# Patient Record
Sex: Female | Born: 1940 | Race: White | Hispanic: No | Marital: Married | Smoking: Former smoker
Health system: Southern US, Community
[De-identification: ages and names within clinical notes are randomized; demographics above are authoritative.]

## PROBLEM LIST (undated history)

## (undated) DIAGNOSIS — R011 Cardiac murmur, unspecified: Secondary | ICD-10-CM

## (undated) DIAGNOSIS — J385 Laryngeal spasm: Secondary | ICD-10-CM

## (undated) DIAGNOSIS — R251 Tremor, unspecified: Secondary | ICD-10-CM

## (undated) DIAGNOSIS — F329 Major depressive disorder, single episode, unspecified: Secondary | ICD-10-CM

## (undated) DIAGNOSIS — J45909 Unspecified asthma, uncomplicated: Secondary | ICD-10-CM

## (undated) DIAGNOSIS — K5190 Ulcerative colitis, unspecified, without complications: Secondary | ICD-10-CM

## (undated) DIAGNOSIS — I350 Nonrheumatic aortic (valve) stenosis: Secondary | ICD-10-CM

## (undated) DIAGNOSIS — E669 Obesity, unspecified: Secondary | ICD-10-CM

## (undated) DIAGNOSIS — L309 Dermatitis, unspecified: Secondary | ICD-10-CM

## (undated) DIAGNOSIS — F32A Depression, unspecified: Secondary | ICD-10-CM

## (undated) DIAGNOSIS — M810 Age-related osteoporosis without current pathological fracture: Secondary | ICD-10-CM

## (undated) DIAGNOSIS — M8580 Other specified disorders of bone density and structure, unspecified site: Secondary | ICD-10-CM

## (undated) DIAGNOSIS — D649 Anemia, unspecified: Secondary | ICD-10-CM

## (undated) DIAGNOSIS — J984 Other disorders of lung: Secondary | ICD-10-CM

## (undated) DIAGNOSIS — Z87442 Personal history of urinary calculi: Secondary | ICD-10-CM

## (undated) HISTORY — DX: Tremor, unspecified: R25.1

## (undated) HISTORY — DX: Cardiac murmur, unspecified: R01.1

## (undated) HISTORY — PX: VOCAL CORD INJECTION: SHX2663

## (undated) HISTORY — DX: Anemia, unspecified: D64.9

## (undated) HISTORY — PX: COLONOSCOPY: SHX174

## (undated) HISTORY — DX: Age-related osteoporosis without current pathological fracture: M81.0

## (undated) HISTORY — DX: Major depressive disorder, single episode, unspecified: F32.9

## (undated) HISTORY — DX: Other disorders of lung: J98.4

## (undated) HISTORY — DX: Laryngeal spasm: J38.5

## (undated) HISTORY — DX: Obesity, unspecified: E66.9

## (undated) HISTORY — DX: Unspecified asthma, uncomplicated: J45.909

## (undated) HISTORY — DX: Depression, unspecified: F32.A

## (undated) HISTORY — DX: Ulcerative colitis, unspecified, without complications: K51.90

## (undated) HISTORY — DX: Dermatitis, unspecified: L30.9

## (undated) HISTORY — DX: Other specified disorders of bone density and structure, unspecified site: M85.80

---

## 2005-04-17 ENCOUNTER — Emergency Department (HOSPITAL_COMMUNITY): Admission: EM | Admit: 2005-04-17 | Discharge: 2005-04-17 | Payer: Self-pay | Admitting: Family Medicine

## 2005-12-24 ENCOUNTER — Ambulatory Visit: Payer: Self-pay | Admitting: Gastroenterology

## 2006-01-12 ENCOUNTER — Ambulatory Visit: Payer: Self-pay | Admitting: Gastroenterology

## 2006-01-12 ENCOUNTER — Encounter (INDEPENDENT_AMBULATORY_CARE_PROVIDER_SITE_OTHER): Payer: Self-pay | Admitting: *Deleted

## 2006-01-22 ENCOUNTER — Encounter: Admission: RE | Admit: 2006-01-22 | Discharge: 2006-01-22 | Payer: Self-pay | Admitting: Unknown Physician Specialty

## 2009-07-28 ENCOUNTER — Emergency Department (HOSPITAL_COMMUNITY)
Admission: EM | Admit: 2009-07-28 | Discharge: 2009-07-28 | Payer: Self-pay | Source: Home / Self Care | Admitting: Emergency Medicine

## 2009-08-02 DIAGNOSIS — L301 Dyshidrosis [pompholyx]: Secondary | ICD-10-CM | POA: Insufficient documentation

## 2009-08-02 DIAGNOSIS — R011 Cardiac murmur, unspecified: Secondary | ICD-10-CM

## 2009-08-02 HISTORY — DX: Cardiac murmur, unspecified: R01.1

## 2010-04-25 DIAGNOSIS — R053 Chronic cough: Secondary | ICD-10-CM | POA: Insufficient documentation

## 2010-06-16 DIAGNOSIS — J45909 Unspecified asthma, uncomplicated: Secondary | ICD-10-CM | POA: Insufficient documentation

## 2011-08-06 ENCOUNTER — Other Ambulatory Visit: Payer: Self-pay | Admitting: Internal Medicine

## 2011-08-06 DIAGNOSIS — Z1231 Encounter for screening mammogram for malignant neoplasm of breast: Secondary | ICD-10-CM

## 2011-11-09 ENCOUNTER — Ambulatory Visit: Payer: Self-pay

## 2011-11-24 ENCOUNTER — Ambulatory Visit
Admission: RE | Admit: 2011-11-24 | Discharge: 2011-11-24 | Disposition: A | Discharge status: Home / Self Care | Payer: Medicare Other | Source: Ambulatory Visit | Attending: Internal Medicine | Admitting: Internal Medicine

## 2011-11-24 DIAGNOSIS — Z1231 Encounter for screening mammogram for malignant neoplasm of breast: Secondary | ICD-10-CM

## 2012-08-12 DIAGNOSIS — R7301 Impaired fasting glucose: Secondary | ICD-10-CM | POA: Insufficient documentation

## 2012-08-12 DIAGNOSIS — E785 Hyperlipidemia, unspecified: Secondary | ICD-10-CM

## 2012-08-12 HISTORY — DX: Impaired fasting glucose: R73.01

## 2012-08-12 HISTORY — DX: Hyperlipidemia, unspecified: E78.5

## 2012-08-18 ENCOUNTER — Ambulatory Visit (HOSPITAL_COMMUNITY): Payer: Medicare Other | Attending: Cardiology | Admitting: Radiology

## 2012-08-18 ENCOUNTER — Other Ambulatory Visit (HOSPITAL_COMMUNITY): Payer: Self-pay | Admitting: Radiology

## 2012-08-18 VITALS — BP 120/74 | Ht 62.0 in | Wt 198.0 lb

## 2012-08-18 DIAGNOSIS — R011 Cardiac murmur, unspecified: Secondary | ICD-10-CM

## 2012-08-18 NOTE — Progress Notes (Signed)
Echocardiogram performed.  

## 2012-08-19 ENCOUNTER — Encounter (HOSPITAL_COMMUNITY): Payer: Self-pay | Admitting: Internal Medicine

## 2013-03-20 ENCOUNTER — Other Ambulatory Visit: Payer: Self-pay

## 2013-03-20 DIAGNOSIS — Z1231 Encounter for screening mammogram for malignant neoplasm of breast: Secondary | ICD-10-CM

## 2013-03-22 ENCOUNTER — Ambulatory Visit
Admission: RE | Admit: 2013-03-22 | Discharge: 2013-03-22 | Disposition: A | Discharge status: Home / Self Care | Payer: Medicare Other | Source: Ambulatory Visit

## 2013-03-22 DIAGNOSIS — Z1231 Encounter for screening mammogram for malignant neoplasm of breast: Secondary | ICD-10-CM

## 2013-04-25 DIAGNOSIS — R251 Tremor, unspecified: Secondary | ICD-10-CM | POA: Insufficient documentation

## 2013-05-05 ENCOUNTER — Other Ambulatory Visit (HOSPITAL_COMMUNITY): Payer: Self-pay | Admitting: Internal Medicine

## 2013-05-05 DIAGNOSIS — J45901 Unspecified asthma with (acute) exacerbation: Secondary | ICD-10-CM

## 2013-05-05 LAB — PULMONARY FUNCTION TEST
DL/VA % PRED: 105 %
DL/VA: 4.94 ml/min/mmHg/L
DLCO cor % pred: 74 %
DLCO cor: 16.97 ml/min/mmHg
DLCO unc % pred: 74 %
DLCO unc: 16.97 ml/min/mmHg
FEF 25-75 POST: 3.8 L/s
FEF 25-75 Pre: 2.13 L/sec
FEF2575-%Change-Post: 78 %
FEF2575-%PRED-POST: 219 %
FEF2575-%Pred-Pre: 123 %
FEV1-%Change-Post: 15 %
FEV1-%PRED-POST: 85 %
FEV1-%PRED-PRE: 73 %
FEV1-POST: 1.79 L
FEV1-Pre: 1.55 L
FEV1FVC-%CHANGE-POST: 1 %
FEV1FVC-%Pred-Pre: 114 %
FEV6-%CHANGE-POST: 12 %
FEV6-%PRED-PRE: 68 %
FEV6-%Pred-Post: 76 %
FEV6-PRE: 1.81 L
FEV6-Post: 2.04 L
FEV6FVC-%Pred-Post: 105 %
FEV6FVC-%Pred-Pre: 105 %
FVC-%Change-Post: 13 %
FVC-%PRED-POST: 73 %
FVC-%Pred-Pre: 64 %
FVC-PRE: 1.81 L
FVC-Post: 2.06 L
POST FEV1/FVC RATIO: 87 %
POST FEV6/FVC RATIO: 100 %
PRE FEV1/FVC RATIO: 86 %
PRE FEV6/FVC RATIO: 100 %
RV % PRED: 58 %
RV: 1.28 L
TLC % pred: 63 %
TLC: 3.12 L

## 2013-05-10 ENCOUNTER — Ambulatory Visit (HOSPITAL_COMMUNITY)
Admission: RE | Admit: 2013-05-10 | Discharge: 2013-05-10 | Disposition: A | Discharge status: Home / Self Care | Payer: Medicare Other | Source: Ambulatory Visit | Attending: Internal Medicine | Admitting: Internal Medicine

## 2013-05-10 DIAGNOSIS — J45909 Unspecified asthma, uncomplicated: Secondary | ICD-10-CM | POA: Insufficient documentation

## 2013-05-10 MED ORDER — ALBUTEROL SULFATE (2.5 MG/3ML) 0.083% IN NEBU
2.5000 mg | INHALATION_SOLUTION | Freq: Once | RESPIRATORY_TRACT | Status: AC
  Administered 2013-05-10: 2.5 mg via RESPIRATORY_TRACT

## 2013-05-15 ENCOUNTER — Encounter: Payer: Self-pay | Admitting: Neurology

## 2013-05-23 ENCOUNTER — Encounter: Payer: Self-pay | Admitting: Neurology

## 2013-05-23 ENCOUNTER — Ambulatory Visit (INDEPENDENT_AMBULATORY_CARE_PROVIDER_SITE_OTHER): Payer: Medicare Other | Admitting: Neurology

## 2013-05-23 VITALS — BP 120/60 | HR 72 | Resp 14 | Ht 63.0 in | Wt 209.0 lb

## 2013-05-23 DIAGNOSIS — G250 Essential tremor: Secondary | ICD-10-CM

## 2013-05-23 DIAGNOSIS — J383 Other diseases of vocal cords: Secondary | ICD-10-CM

## 2013-05-23 DIAGNOSIS — G252 Other specified forms of tremor: Secondary | ICD-10-CM

## 2013-05-23 DIAGNOSIS — J387 Other diseases of larynx: Secondary | ICD-10-CM

## 2013-05-23 HISTORY — DX: Essential tremor: G25.0

## 2013-05-23 NOTE — Progress Notes (Signed)
Subjective:    Alexis Brady was seen in consultation in the movement disorder clinic at the request of Marton Redwood, MD.  The evaluation is for tremor.  Tremor started in the hands about 15 years ago.  It was in the bilateral hands, L greater than R.  She was seen in [place] for the tremor.  It got worse over the last 2 years.  She is now notices voice and head tremor.  Her signature and writing bothers her the most now.  she does admit that the tremor bothers her family more than it bothers her and her children question whether she has PD.   She has trouble flipping pancakes.  She was recently tried on primidone 50 mg; she took it for 2 months and it didn't seem to help so she d/c it.  She wasn't sure that she liked the way that it made her feel. There is a family hx of tremor in her older sister.    Affected by caffeine:  no Affected by alcohol:  unknown Affected by stress:  yes Affected by fatigue:  no Spills soup if on spoon:  yes and a bigger spoon helps Spills glass of liquid if full:  yes Affects ADL's (tying shoes, brushing teeth, etc):  no  Current/Previously tried tremor medications: primidone (low dose, no help but she didn't like the way it made her feel - little dizzy)  Current medications that may exacerbate tremor:  Albuterol but almost never uses it  Outside reports reviewed: historical medical records and referral letter/letters.  No Known Allergies  Current Outpatient Prescriptions on File Prior to Visit  Medication Sig Dispense Refill  . albuterol (PROVENTIL HFA;VENTOLIN HFA) 108 (90 BASE) MCG/ACT inhaler Inhale into the lungs every 6 (six) hours as needed for wheezing or shortness of breath.      . budesonide-formoterol (SYMBICORT) 80-4.5 MCG/ACT inhaler Inhale 2 puffs into the lungs 2 (two) times daily.      . calcium citrate-vitamin D (CITRACAL+D) 315-200 MG-UNIT per tablet Take 1 tablet by mouth 2 (two) times daily.      . cholecalciferol (VITAMIN D) 400 UNITS  TABS tablet Take 400 Units by mouth daily.      Marland Kitchen co-enzyme Q-10 30 MG capsule Take 100 mg by mouth daily.      Marland Kitchen esomeprazole (NEXIUM) 20 MG capsule Take 20 mg by mouth daily.      Marland Kitchen ibuprofen (ADVIL,MOTRIN) 200 MG tablet Take 200 mg by mouth every 6 (six) hours as needed.       No current facility-administered medications on file prior to visit.    Past Medical History  Diagnosis Date  . Tremor   . Osteopenia   . Depression   . Anemia   . Asthma   . Heart murmur   . Eczema     Past Surgical History  Procedure Laterality Date  . Vocal cord injection      History   Social History  . Marital Status: Single    Spouse Name: N/A    Number of Children: N/A  . Years of Education: N/A   Occupational History  . Not on file.   Social History Main Topics  . Smoking status: Former Smoker    Quit date: 05/16/1983  . Smokeless tobacco: Not on file  . Alcohol Use: No  . Drug Use: No  . Sexual Activity: Not on file   Other Topics Concern  . Not on file   Social History Narrative  .  No narrative on file    Family Status  Relation Status Death Age  . Mother Deceased 29    CVA  . Sister Alive     Diabetes, HTN, Fibromyalgia  . Son Alive     healthy  . Son Deceased 87    MVA  . Father Deceased 51    CHF, MI    Review of Systems A complete 10 system ROS was obtained and was negative apart from what is mentioned.   Objective:   VITALS:   Filed Vitals:   05/23/13 0846  BP: 120/60  Pulse: 72  Resp: 14  Height: 5' 3"  (1.6 m)  Weight: 209 lb (94.802 kg)   Gen:  Appears stated age and in NAD. HEENT:  Normocephalic, atraumatic. The mucous membranes are moist. The superficial temporal arteries are without ropiness or tenderness. Cardiovascular: Regular rate and rhythm. Lungs: Clear to auscultation bilaterally. Neck: There are no carotid bruits noted bilaterally.  NEUROLOGICAL:  Orientation:  The patient is alert and oriented x 3.  Recent and remote memory are  intact.  Attention span and concentration are normal.  Able to name objects and repeat without trouble.  Fund of knowledge is appropriate Cranial nerves: There is good facial symmetry. The pupils are equal round and reactive to light bilaterally. Fundoscopic exam reveals clear disc margins bilaterally. Extraocular muscles are intact and visual fields are full to confrontational testing. Speech is fluent and clear. Soft palate rises symmetrically and there is no tongue deviation. Hearing is intact to conversational tone. Tone: Tone is good throughout. Sensation: Sensation is intact to light touch and pinprick throughout (facial, trunk, extremities). Vibration is intact at the bilateral big toe. There is no extinction with double simultaneous stimulation. There is no sensory dermatomal level identified. Coordination:  The patient has no dysdiadichokinesia or dysmetria. Motor: Strength is 5/5 in the bilateral upper and lower extremities.  Shoulder shrug is equal bilaterally.  There is no pronator drift.  There are no fasciculations noted. DTR's: Deep tendon reflexes are 2/4 at the bilateral biceps, triceps, brachioradialis, patella and achilles.  Plantar responses are downgoing bilaterally. Gait and Station: The patient is able to ambulate without difficulty. The patient is able to heel toe walk without any difficulty. The patient is able to ambulate in a tandem fashion. The patient is able to stand in the Romberg position.   MOVEMENT EXAM: Tremor:  There is mild tremor in the UE, noted most significantly with action.  The L is worse than the R.  When she writes, she has some tremor of the non-dominant hand that is resting.  When writing, she has complex head titubation.  There is no null point.   The patient is able to draw Archimedes spirals with minimal difficulty.  She has some trouble writing her name, but not necessarily printing it.    The patient is  able to pour water from one glass to another  without spilling it but tremor is evident in the hand that holds the full glass steady.     Assessment/Plan:   1.  Essential Tremor.  -This is evidenced by the symmetrical nature and longstanding hx of gradually getting worse.  I don't think that this represents dystonic tremor as there is no null point.  Unfortunately, therefore, botox will not help the neck.  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  -We talked about other tx's for ET (primarily topamax as she didn't tolerate primidone and likely not beta blocker candidate with asthma) but she didn't think that sx's were bad enough right now.  She wanted reassurance that she didn't have PD and I gave that to her today.  She will let me know if she changes her mind in the future and would like to treatment.  I gave her patient education resources.  I provided her information on the international essential tremor Foundation.  We talked about adaptive devices.  Greater than 50% of the visit was spent in counseling in this regard.

## 2013-05-23 NOTE — Patient Instructions (Signed)
1.  The international essential tremor foundation will have information for you 2.  Try the tervis cups for your coffee.  You can get them at many places, but [facility] has them 3.  Go [employer]! 4.  I will see you as needed!

## 2013-05-25 ENCOUNTER — Telehealth: Payer: Self-pay | Admitting: Neurology

## 2013-05-25 NOTE — Telephone Encounter (Signed)
Spoke

## 2013-05-25 NOTE — Telephone Encounter (Signed)
Patient was looking on the essential tremors website. She read about a vitamin combination that she can order from them. She called this Trimidone. She wants to know your thoughts and recommendations on this.

## 2013-05-25 NOTE — Telephone Encounter (Signed)
Ive looked on the IETF website and cannot find the information for this.  I even tried googling that name and didn't see anything but the primidone (not trimidone).  That being said, they have never found a vitamin supplement that helps tremor but if she gives me the information I would be glad to research

## 2013-05-25 NOTE — Telephone Encounter (Signed)
Has some questions about tremors medication please call her back at [phone] or [phone]

## 2013-05-25 NOTE — Telephone Encounter (Signed)
Spoke with patient and explained that you did not know if any vitamins that have been proven to help with tremor. 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 She was searching on google and not on the foundation website. She found a link for [facility], which is the vitamin she was talking about. I told her I would get your advise on this and call back.

## 2013-05-25 NOTE — Telephone Encounter (Signed)
Patient made aware that Dr Tat does not recommend.

## 2013-05-25 NOTE — Telephone Encounter (Signed)
I did research it.  Don't recommend it.  Think that she will spend money without benefit.

## 2014-06-13 ENCOUNTER — Other Ambulatory Visit: Payer: Self-pay

## 2014-06-13 DIAGNOSIS — Z9289 Personal history of other medical treatment: Secondary | ICD-10-CM

## 2014-07-02 ENCOUNTER — Ambulatory Visit
Admission: RE | Admit: 2014-07-02 | Discharge: 2014-07-02 | Disposition: A | Discharge status: Home / Self Care | Payer: Medicare Other | Source: Ambulatory Visit

## 2014-07-02 DIAGNOSIS — Z9289 Personal history of other medical treatment: Secondary | ICD-10-CM

## 2015-03-01 ENCOUNTER — Encounter: Payer: Self-pay | Admitting: Neurology

## 2015-03-01 ENCOUNTER — Ambulatory Visit (INDEPENDENT_AMBULATORY_CARE_PROVIDER_SITE_OTHER): Payer: Medicare Other | Admitting: Neurology

## 2015-03-01 VITALS — BP 124/82 | HR 77 | Ht 62.0 in | Wt 195.0 lb

## 2015-03-01 DIAGNOSIS — G250 Essential tremor: Secondary | ICD-10-CM

## 2015-03-01 MED ORDER — TOPIRAMATE 25 MG PO TABS: ORAL_TABLET | ORAL | Status: DC

## 2015-03-01 MED ORDER — TOPIRAMATE 100 MG PO TABS: 100.0000 mg | ORAL_TABLET | Freq: Every day | ORAL | Status: DC

## 2015-03-01 NOTE — Addendum Note (Signed)
Addended byMargarette Asal L on: 03/01/2015 11:02 AM   Modules accepted: Orders

## 2015-03-01 NOTE — Patient Instructions (Signed)
Start  topamax 25 mg, 1 tablet daily for 1 week, then 2 tablets daily for 1 week and then 3 tablets daily for 1 week and then you will start topamax 100 mg, one tablet daily

## 2015-03-01 NOTE — Progress Notes (Signed)
Subjective:    Alexis Brady was seen in consultation in the movement disorder clinic at the request of Marton Redwood, MD.  The evaluation is for tremor.  Tremor started in the hands about 15 years ago.  It was in the bilateral hands, L greater than R.  She was seen in [place] for the tremor.  It got worse over the last 2 years.  She is now notices voice and head tremor.  Her signature and writing bothers her the most now.  she does admit that the tremor bothers her family more than it bothers her and her children question whether she has PD.   She has trouble flipping pancakes.  She was recently tried on primidone 50 mg; she took it for 2 months and it didn't seem to help so she d/c it.  She wasn't sure that she liked the way that it made her feel. There is a family hx of tremor in her older sister.    03/01/15 update:  The patient is following up today.  I have not seen her in almost 2 years.  She has a history of essential tremor.  Last visit, she opted to take no medication.  She called me a few days after I saw her last and asked me about trying some type of vitamin for essential tremor, which I did not recommend.  We talked about Topamax last visit and ultimately she decided not to try it.  She had tried low-dose primidone in the past and did not find it effective and thought it made her a little dizzy.  She is not a beta blocker candidate because of asthma.  She does tell me that her PCP gave her a medication but it made her sleepy; however, she has no idea what that medication was.  Upon naming some, she thinks that it was primidone again.  She is having more trouble with vocal tremor and had some head tremor as well.  She is spilling soup and coffee and can barely read her handwriting.  Her handwriting is actually worse in the morning than the evening.  She just started albuterol and it has not affected tremor.    Current/Previously tried tremor medications: primidone (low dose, no help but she  didn't like the way it made her feel - little dizzy)  Current medications that may exacerbate tremor:  Albuterol but almost never uses it  Outside reports reviewed: historical medical records and referral letter/letters.  No Known Allergies  Current Outpatient Prescriptions on File Prior to Visit  Medication Sig Dispense Refill  . albuterol (PROVENTIL HFA;VENTOLIN HFA) 108 (90 BASE) MCG/ACT inhaler Inhale into the lungs every 6 (six) hours as needed for wheezing or shortness of breath.    . calcium citrate-vitamin D (CITRACAL+D) 315-200 MG-UNIT per tablet Take 1 tablet by mouth 2 (two) times daily.    . cholecalciferol (VITAMIN D) 400 UNITS TABS tablet Take 400 Units by mouth daily.    Marland Kitchen co-enzyme Q-10 30 MG capsule Take 100 mg by mouth daily.    Marland Kitchen ibuprofen (ADVIL,MOTRIN) 200 MG tablet Take 200 mg by mouth every 6 (six) hours as needed.    . Multiple Vitamin (MULTIVITAMIN) tablet Take 1 tablet by mouth daily.     No current facility-administered medications on file prior to visit.    Past Medical History  Diagnosis Date  . Tremor   . Osteopenia   . Depression   . Anemia   . Asthma     only  when had bronchitis per pt  . Heart murmur   . Eczema     Past Surgical History  Procedure Laterality Date  . Vocal cord injection      Social History   Social History  . Marital Status: Single    Spouse Name: N/A  . Number of Children: N/A  . Years of Education: N/A   Occupational History  . Not on file.   Social History Main Topics  . Smoking status: Former Smoker    Quit date: 05/16/1983  . Smokeless tobacco: Not on file  . Alcohol Use: No  . Drug Use: No  . Sexual Activity: Not on file   Other Topics Concern  . Not on file   Social History Narrative    Family Status  Relation Status Death Age  . Mother Deceased 45    CVA  . Sister Alive     Diabetes, HTN, Fibromyalgia  . Son Alive     healthy  . Son Deceased 34    MVA  . Father Deceased 80    CHF, MI     Review of Systems A complete 10 system ROS was obtained and was negative apart from what is mentioned.   Objective:   VITALS:   Filed Vitals:   03/01/15 0808  BP: 124/82  Pulse: 77  Height: 5' 2"  (1.575 m)  Weight: 195 lb (88.451 kg)   Gen:  Appears stated age and in NAD. HEENT:  Normocephalic, atraumatic. The mucous membranes are moist. The superficial temporal arteries are without ropiness or tenderness. Cardiovascular: Regular rate and rhythm with 3/6 SEM (radiates to bilateral carotids, L more than R) Lungs: Clear to auscultation bilaterally. Neck: There are no carotid bruits noted bilaterally.  NEUROLOGICAL:  Orientation:  The patient is alert and oriented x 3.  Recent and remote memory are intact.  Attention span and concentration are normal.  Able to name objects and repeat without trouble.  Fund of knowledge is appropriate Cranial nerves: There is good facial symmetry. The pupils are equal round and reactive to light bilaterally. Fundoscopic exam reveals clear disc margins bilaterally. Extraocular muscles are intact and visual fields are full to confrontational testing. Speech is fluent and clear but she does have vocal tremor. Soft palate rises symmetrically and there is no tongue deviation. Hearing is intact to conversational tone. Tone: Tone is good throughout. Sensation: Sensation is intact to light touch throughout Coordination:  The patient has no dysdiadichokinesia or dysmetria. Motor: Strength is 5/5 in the bilateral upper and lower extremities.  Shoulder shrug is equal bilaterally.  There is no pronator drift.  There are no fasciculations noted. Gait and Station: The patient is able to ambulate without difficulty. The patient has difficulty ambulating in a tandem fashion.  The patient is able to stand in the Romberg position.   MOVEMENT EXAM: Tremor:  There is mild tremor in the UE, noted most significantly with action. When writing, she has head tremor in the  "yes" direction. There is no null point.   The patient is able to draw Archimedes spirals with minimal difficulty but she does have some trouble getting the L hand down on the page.  The patient is able to pour water from one glass to another without spilling it but tremor is evident in the hand that holds the full glass steady.     Assessment/Plan:   1.  Essential Tremor.  -This is evidenced by the symmetrical nature and longstanding hx of gradually getting  worse.  I don't think that this represents dystonic tremor as there is no null point.  Unfortunately, therefore, botox will not help the neck.  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  Discussed this in detail today as we did at last visit  -We talked about other tx's for ET (primarily topamax and gabapentin as she didn't tolerate primidone and likely not beta blocker candidate with asthma).  She would like to try topamax.  Risks, benefits, side effects and alternative therapies were discussed.  The opportunity to ask questions was given and they were answered to the best of my ability.  The patient expressed understanding and willingness to follow the outlined treatment protocols. 2.  Follow up is anticipated in the next few months, sooner should new neurologic issues arise.   Much greater than 50% of this visit was spent in counseling with the patient and the family.  Total face to face time:  25 min

## 2015-03-19 ENCOUNTER — Emergency Department (HOSPITAL_COMMUNITY)
Admission: EM | Admit: 2015-03-19 | Discharge: 2015-03-19 | Disposition: A | Discharge status: Home / Self Care | Payer: Medicare Other | Attending: Emergency Medicine | Admitting: Emergency Medicine

## 2015-03-19 ENCOUNTER — Emergency Department (HOSPITAL_COMMUNITY): Payer: Medicare Other

## 2015-03-19 ENCOUNTER — Encounter (HOSPITAL_COMMUNITY): Payer: Self-pay | Admitting: Emergency Medicine

## 2015-03-19 DIAGNOSIS — M8580 Other specified disorders of bone density and structure, unspecified site: Secondary | ICD-10-CM | POA: Diagnosis not present

## 2015-03-19 DIAGNOSIS — Z862 Personal history of diseases of the blood and blood-forming organs and certain disorders involving the immune mechanism: Secondary | ICD-10-CM | POA: Insufficient documentation

## 2015-03-19 DIAGNOSIS — R011 Cardiac murmur, unspecified: Secondary | ICD-10-CM | POA: Insufficient documentation

## 2015-03-19 DIAGNOSIS — N23 Unspecified renal colic: Secondary | ICD-10-CM | POA: Diagnosis not present

## 2015-03-19 DIAGNOSIS — Z872 Personal history of diseases of the skin and subcutaneous tissue: Secondary | ICD-10-CM | POA: Diagnosis not present

## 2015-03-19 DIAGNOSIS — J45909 Unspecified asthma, uncomplicated: Secondary | ICD-10-CM | POA: Insufficient documentation

## 2015-03-19 DIAGNOSIS — R109 Unspecified abdominal pain: Secondary | ICD-10-CM

## 2015-03-19 DIAGNOSIS — F329 Major depressive disorder, single episode, unspecified: Secondary | ICD-10-CM | POA: Diagnosis not present

## 2015-03-19 DIAGNOSIS — Z87891 Personal history of nicotine dependence: Secondary | ICD-10-CM | POA: Diagnosis not present

## 2015-03-19 LAB — URINE MICROSCOPIC-ADD ON
BACTERIA UA: NONE SEEN
RBC / HPF: NONE SEEN RBC/hpf (ref 0–5)
WBC UA: NONE SEEN WBC/hpf (ref 0–5)

## 2015-03-19 LAB — BASIC METABOLIC PANEL
Anion gap: 7 (ref 5–15)
BUN: 23 mg/dL — ABNORMAL HIGH (ref 6–20)
CHLORIDE: 110 mmol/L (ref 101–111)
CO2: 23 mmol/L (ref 22–32)
CREATININE: 0.86 mg/dL (ref 0.44–1.00)
Calcium: 9.1 mg/dL (ref 8.9–10.3)
GFR calc non Af Amer: >60 mL/min (ref >60)
Glucose, Bld: 120 mg/dL — ABNORMAL HIGH (ref 65–99)
POTASSIUM: 4.3 mmol/L (ref 3.5–5.1)
SODIUM: 140 mmol/L (ref 135–145)

## 2015-03-19 LAB — URINALYSIS, ROUTINE W REFLEX MICROSCOPIC
Bilirubin Urine: NEGATIVE
Glucose, UA: NEGATIVE mg/dL
KETONES UR: NEGATIVE mg/dL
LEUKOCYTES UA: NEGATIVE
NITRITE: NEGATIVE
PROTEIN: NEGATIVE mg/dL
Specific Gravity, Urine: 1.012 (ref 1.005–1.030)
pH: 8 (ref 5.0–8.0)

## 2015-03-19 LAB — CBC
HCT: 39.5 % (ref 36.0–46.0)
HEMOGLOBIN: 12.5 g/dL (ref 12.0–15.0)
MCH: 19.7 pg — ABNORMAL LOW (ref 26.0–34.0)
MCHC: 31.6 g/dL (ref 30.0–36.0)
MCV: 62.2 fL — ABNORMAL LOW (ref 78.0–100.0)
Platelets: 182 10*3/uL (ref 150–400)
RBC: 6.35 MIL/uL — AB (ref 3.87–5.11)
RDW: 16.5 % — ABNORMAL HIGH (ref 11.5–15.5)
WBC: 8 10*3/uL (ref 4.0–10.5)

## 2015-03-19 MED ORDER — ONDANSETRON HCL 4 MG/2ML IJ SOLN
4.0000 mg | Freq: Once | INTRAMUSCULAR | Status: AC
  Administered 2015-03-19: 4 mg via INTRAVENOUS
  Filled 2015-03-19: qty 2

## 2015-03-19 MED ORDER — FENTANYL CITRATE (PF) 100 MCG/2ML IJ SOLN
INTRAMUSCULAR | Status: AC
  Filled 2015-03-19: qty 2

## 2015-03-19 MED ORDER — OXYCODONE-ACETAMINOPHEN 5-325 MG PO TABS: 1.0000 | ORAL_TABLET | Freq: Four times a day (QID) | ORAL | Status: DC | PRN

## 2015-03-19 MED ORDER — FENTANYL CITRATE (PF) 100 MCG/2ML IJ SOLN
50.0000 ug | Freq: Once | INTRAMUSCULAR | Status: AC
  Administered 2015-03-19: 50 ug via INTRAVENOUS

## 2015-03-19 NOTE — ED Provider Notes (Signed)
CSN: [id number]     Arrival date & time 03/19/15  1042 History   First MD Initiated Contact with Patient 03/19/15 1147     Chief Complaint  Patient presents with  . Flank Pain     (Consider location/radiation/quality/duration/timing/severity/associated sxs/prior Treatment) HPI  Pt presents with c/o right flank pain.  Pain started acutely approx 2 hours prior to arrival.  She states pain was sharp and she felt "like my insides were going to burst"  "worse than giving birth".  No fever/chills.  Has had some nausea, but no vomiting.  Felt the need to urinate frequently and was passing small amounts of urine.  No gross blood.  Pt has not had similar symptoms in the past.  Pain radiated to lower abdomen.  Currently she has no pain after one dose of fentanyl.  There are no other associated systemic symptoms, there are no other alleviating or modifying factors.   Past Medical History  Diagnosis Date  . Tremor   . Osteopenia   . Depression   . Anemia   . Asthma     only when had bronchitis per pt  . Heart murmur   . Eczema    Past Surgical History  Procedure Laterality Date  . Vocal cord injection     History reviewed. No pertinent family history. Social History  Substance Use Topics  . Smoking status: Former Smoker    Quit date: 05/16/1983  . Smokeless tobacco: None  . Alcohol Use: No   OB History    No data available     Review of Systems  ROS reviewed and all otherwise negative except for mentioned in HPI    Allergies  Review of patient's allergies indicates no known allergies.  Home Medications   Prior to Admission medications   Medication Sig Start Date End Date Taking? Authorizing Provider  albuterol (PROVENTIL HFA;VENTOLIN HFA) 108 (90 BASE) MCG/ACT inhaler Inhale into the lungs every 6 (six) hours as needed for wheezing or shortness of breath.   Yes Historical Provider, MD  calcium citrate-vitamin D (CITRACAL+D) 315-200 MG-UNIT per tablet Take 1 tablet by mouth 2  (two) times daily.   Yes Historical Provider, MD  cholecalciferol (VITAMIN D) 400 UNITS TABS tablet Take 400 Units by mouth daily.   Yes Historical Provider, MD  co-enzyme Q-10 30 MG capsule Take 100 mg by mouth daily.   Yes Historical Provider, MD  ibuprofen (ADVIL,MOTRIN) 200 MG tablet Take 200-600 mg by mouth every 6 (six) hours as needed for headache, mild pain or moderate pain.    Yes Historical Provider, MD  Multiple Vitamin (MULTIVITAMIN) tablet Take 1 tablet by mouth daily.   Yes Historical Provider, MD  topiramate (TOPAMAX) 25 MG tablet Take one tablet daily for one week, then two tablets daily for one week, then three tablets daily for one week, then start 100 mg tablets 03/01/15  Yes Rebecca S Tat, DO  oxyCODONE-acetaminophen (PERCOCET/ROXICET) 5-325 MG tablet Take 1-2 tablets by mouth every 6 (six) hours as needed for severe pain. 03/19/15   Alfonzo Beers, MD  topiramate (TOPAMAX) 100 MG tablet Take 1 tablet (100 mg total) by mouth daily. Patient not taking: Reported on 03/19/2015 03/01/15   Wells Guiles S Tat, DO   BP 130/54 mmHg  Pulse 81  Temp(Src) 97.9 F (36.6 C) (Oral)  Resp 16  SpO2 98%  Vitals reviewed Physical Exam  Physical Examination: General appearance - alert, well appearing, and in no distress Mental status - alert, oriented to person,  place, and time Eyes - no conjunctival injection, no scleral icterus Mouth - mucous membranes moist, pharynx normal without lesions Chest - clear to auscultation, no wheezes, rales or rhonchi, symmetric air entry Heart - normal rate, regular rhythm, normal S1, S2, no murmurs, rubs, clicks or gallops Abdomen - soft, nontender, nondistended, no masses or organomegaly Back exam - full range of motion, no midline tenderness, no CVA tenderness Neurological - alert, oriented, normal speech Extremities - peripheral pulses normal, no pedal edema, no clubbing or cyanosis Skin - normal coloration and turgor, no rashes  ED Course  Procedures  (including critical care time) Labs Review Labs Reviewed  URINALYSIS, ROUTINE W REFLEX MICROSCOPIC (NOT AT [facility]) - Abnormal; Notable for the following:    APPearance TURBID (*)    Hgb urine dipstick TRACE (*)    All other components within normal limits  CBC - Abnormal; Notable for the following:    RBC 6.35 (*)    MCV 62.2 (*)    MCH 19.7 (*)    RDW 16.5 (*)    All other components within normal limits  BASIC METABOLIC PANEL - Abnormal; Notable for the following:    Glucose, Bld 120 (*)    BUN 23 (*)    All other components within normal limits  URINE MICROSCOPIC-ADD ON - Abnormal; Notable for the following:    Squamous Epithelial / LPF 0-5 (*)    All other components within normal limits  URINE CULTURE    Imaging Review Ct Renal Stone Study  03/19/2015  CLINICAL DATA:  74 year old female with acute right flank and abdominal pain. EXAM: CT ABDOMEN AND PELVIS WITHOUT CONTRAST TECHNIQUE: Multidetector CT imaging of the abdomen and pelvis was performed following the standard protocol without IV contrast. COMPARISON:  None. FINDINGS: Please note that parenchymal abnormalities may be missed without intravenous contrast. Lower chest:  A 5 mm right lower lobe nodule is noted. Hepatobiliary: The liver and gallbladder are unremarkable. There is no evidence of biliary dilatation. Pancreas: Unremarkable Spleen: Unremarkable Adrenals/Urinary Tract: Fullness of the right intrarenal collecting system and ureter noted with right perinephric and periureteral inflammation. No obstructing cause for urinary calculi are identified. The adrenal glands and bladder are unremarkable. Stomach/Bowel: Colonic diverticulosis noted without evidence of diverticulitis. There is no evidence of bowel obstruction or definite bowel wall thickening. The appendix is normal. Vascular/Lymphatic: No enlarged lymph nodes identified. Aortic atherosclerotic calcifications noted without aneurysm. Reproductive: Prominence of the  uterus is noted -question fibroid. Other: No free fluid, focal collection or pneumoperitoneum. Musculoskeletal: No acute or suspicious abnormalities. Lumbar spine scoliosis and degenerative changes are noted. IMPRESSION: Right perinephric and periureteral inflammation and fullness of the right intrarenal collecting system and right ureter. No obstructing cause identified. This may be secondary to a passed stone, infection or nonspecific inflammation. Correlate clinically. 5 mm right lower lobe nodule. If the patient is at high risk for bronchogenic carcinoma, follow-up chest CT at 6-12 months is recommended. If the patient is at low risk for bronchogenic carcinoma, follow-up chest CT at 12 months is recommended. This recommendation follows the consensus statement: Guidelines for Management of Small Pulmonary Nodules Detected on CT Scans: A Statement from the [place] as published in Radiology 2005;237:395-400. Colonic diverticulosis without diverticulitis. Electronically Signed   By: Margarette Canada M.D.   On: 03/19/2015 14:25   I have personally reviewed and evaluated these images and lab results as part of my medical decision-making.   EKG Interpretation None      MDM  Final diagnoses:  Flank pain  Ureteral colic    Pt presenting with acute onset of right flank pain.  In the ED after one dose of fentanyl her pain was relieved and has not since recurred.  Exam is reassuring.  Abdominal exam is benign.  CT scan has appearance of most likely a recently passed stone- however there could be a small stone between CT slices- but this would be less likely to cause the obstruction that is seen on CT.  Discussed results with patient, she will need urology followup.  Discharged with pain and nausea medicaiton.  Pt advised to have f/u with PMD and repeat CT scan for lung findings as well.    3:28 PM pt continues to have no pain.    Alfonzo Beers, MD 03/20/15 1022

## 2015-03-19 NOTE — ED Notes (Signed)
Pt reports R flank pain that began 2 hours ago that radiates to abd and leg. No hx of kidney stones. Feels nauseated but no v/d. LBM yesterday.

## 2015-03-19 NOTE — Discharge Instructions (Signed)
Return to the ED with any concerns including pain not controlled by pain medication, vomiting and not able to keep down liquids, fever/chills, decreased level of alertness/lethargy, or any other alarming symptoms   There was an incidental finding on the CT scan performed- this is as noted below-- you should discuss this with your primary care doctor and you will need a followup CT scan in either 6 or 12 months from now.      5 mm right lower lobe nodule. If the patient is at high risk for bronchogenic carcinoma, follow-up chest CT at 6-12 months is recommended. If the patient is at low risk for bronchogenic carcinoma, follow-up chest CT at 12 months is recommended. This recommendation follows the consensus statement: Guidelines for Management of Small Pulmonary Nodules Detected on CT Scans: A Statement from the [place] as published in Radiology 2005;237:395-400.

## 2015-03-22 ENCOUNTER — Telehealth: Payer: Self-pay | Admitting: Neurology

## 2015-03-22 NOTE — Telephone Encounter (Signed)
Patient made aware 100 mg tablets sent in at the same time as 25 mg tablets. She will call her pharmacy to fill the medication.

## 2015-03-22 NOTE — Telephone Encounter (Signed)
Pt wants [place] to call in a refill on the topamax 100 mg please call her at [phone] she also has a question about the medication

## 2015-05-23 ENCOUNTER — Other Ambulatory Visit: Payer: Self-pay | Admitting: Physician Assistant

## 2015-05-23 DIAGNOSIS — M25511 Pain in right shoulder: Secondary | ICD-10-CM

## 2015-05-30 ENCOUNTER — Ambulatory Visit: Payer: Medicare Other | Admitting: Neurology

## 2015-06-01 ENCOUNTER — Ambulatory Visit
Admission: RE | Admit: 2015-06-01 | Discharge: 2015-06-01 | Disposition: A | Discharge status: Home / Self Care | Payer: Medicare Other | Source: Ambulatory Visit | Attending: Physician Assistant | Admitting: Physician Assistant

## 2015-06-01 DIAGNOSIS — M25511 Pain in right shoulder: Secondary | ICD-10-CM

## 2015-06-26 ENCOUNTER — Ambulatory Visit: Payer: Medicare Other | Attending: Orthopaedic Surgery

## 2015-06-26 DIAGNOSIS — M25511 Pain in right shoulder: Secondary | ICD-10-CM | POA: Diagnosis not present

## 2015-06-26 DIAGNOSIS — M25512 Pain in left shoulder: Secondary | ICD-10-CM | POA: Diagnosis present

## 2015-06-26 DIAGNOSIS — M6281 Muscle weakness (generalized): Secondary | ICD-10-CM | POA: Insufficient documentation

## 2015-06-26 NOTE — Patient Instructions (Signed)
Hold all stretches 5-10 seconds and perform 5-10 times, 3 times a day.   Sitting upright, slide forearm forward along table, bending from the waist until a stretch is felt. Copyright  VHI. All rights reserved.    Clasp hands together and raise arms above head, keeping elbows as straight as possible. Can be done sitting or lying.   Copyright  VHI. All rights reserved.  Scapular Retraction: Rowing (Eccentric) - Arms - Side (Resistance Band) :no band for now.      Hold end of band in each hand. Pull back until elbows are even with trunk. Keep elbows by sides, thumbs up. Slowly release for 3-5 seconds.   Do this many times a day http://ecce.exer.us/227   Copyright  VHI. All rights reserved.   [facility] [address], [place], [place] [address] Phone # [phone] Fax [phone]

## 2015-06-26 NOTE — Therapy (Signed)
[facility] [address] [place], [place], [address] Phone: [phone]   Fax:  [phone]  Physical Therapy Evaluation  Patient Details  Name: Alexis Brady MRN: [id number] Date of Birth: 05/20/1940 Referring Provider: Jean Rosenthal, MD  Encounter Date: 06/26/2015      PT End of Session - 06/26/15 0919    Visit Number 1   Number of Visits 10   Date for PT Re-Evaluation 08/21/15   PT Start Time 6333   PT Stop Time 0926   PT Time Calculation (min) 39 min   Activity Tolerance Patient tolerated treatment well   Behavior During Therapy [facility] for tasks assessed/performed      Past Medical History  Diagnosis Date  . Tremor   . Osteopenia   . Depression   . Anemia   . Asthma     only when had bronchitis per pt  . Heart murmur   . Eczema     Past Surgical History  Procedure Laterality Date  . Vocal cord injection      There were no vitals filed for this visit.  Visit Diagnosis:  Pain in right shoulder - Plan: PT plan of care cert/re-cert  Pain in left shoulder - Plan: PT plan of care cert/re-cert  Muscle weakness (generalized) - Plan: PT plan of care cert/re-cert      Subjective Assessment - 06/26/15 0846    Subjective Pt is a Rt hand dominant female who presents to PT with Rt>Lt shoulder pain and limited use that began 1-2 years ago without incident. Pt reports that last Summer she was not able to perform a full stroke with Rt shoulder in the pool and she could do this motion the previous year.  Pt is now having difficulty with reaching overhead and use of the Rt UE.  Lt shoulder pain began more recently.      Pertinent History Essential tremor   Diagnostic tests MRI: supraspinatus tendinosis, mild subacromial bursitis, AC degenerative changes   Patient Stated Goals reduce Rt shoulder pain, use Rt UE for tucking in shirt/put on seat belt independently, swim with Rt UE   Currently in Pain? Yes    Pain Score 0-No pain  no pain at rest.  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  No   Is the patient reluctant to leave their home because of a fear of falling?  No  Home Environment   Living Environment Private residence   Type of [place]   Prior Function   Level of Independence Independent   Vocation Retired   Leisure Control and instrumentation engineer, swimming (summer), sewing, gardening   Cognition   Overall Cognitive Status Within Functional Limits for tasks assessed   Observation/Other Assessments   Focus on Therapeutic Outcomes (FOTO)  65% limitation   Posture/Postural Control   Posture/Postural Control Postural limitations   Postural Limitations Rounded Shoulders;Forward head   ROM / Strength   AROM / PROM / Strength AROM;PROM;Strength   AROM   Overall AROM  Deficits   AROM Assessment Site Shoulder   Right/Left Shoulder Right;Left   Right Shoulder Flexion 97 Degrees   Right Shoulder ABduction 60 Degrees   Right Shoulder Internal Rotation --  laterl hip   Right Shoulder External  Rotation --  to C7   Left Shoulder Flexion 130 Degrees   Left Shoulder ABduction 95 Degrees   Left Shoulder Internal Rotation --  T8   Left Shoulder External Rotation --  to C7   PROM   Overall PROM  Deficits   PROM Assessment Site Shoulder   Right/Left Shoulder Right   Right Shoulder Flexion 115 Degrees   Right Shoulder ABduction 103 Degrees   Right Shoulder Internal Rotation 50 Degrees   Right Shoulder External Rotation 15 Degrees   Strength   Overall Strength Deficits   Overall Strength Comments Rt shoulder tested in neutral 3+/5 throughout with pain   Strength Assessment Site Shoulder   Right/Left Shoulder Right;Left   Left Shoulder Flexion 4/5   Left Shoulder Extension 4/5   Left Shoulder Internal Rotation 4+/5   Left Shoulder External Rotation 4/5   Palpation   Palpation comment no palpable tenderness today, "pain feels deep".  Joint mobility is limited by 50% in all directions.  Pain with all mobs on the Rt.                             PT Education - 06/26/15 0919    Education provided Yes   Education Details AAROM flexion, scapular squeezes   Person(s) Educated Patient   Methods Explanation;Demonstration;Handout   Comprehension Verbalized understanding;Returned demonstration          PT Short Term Goals - 06/26/15 0935    PT SHORT TERM GOAL #1   Title be independent in initial HEP   Time 8   Period Weeks   Status New   PT SHORT TERM GOAL #2   Title demonstrate Rt shoulder AROM flexion to > or = to 105 degrees to improve use overhead   Time 8   Period Weeks   Status New   PT SHORT TERM GOAL #3   Title report 30% less Rt shoulder pain with putting on seat belt and tucking in shirt   Time 4   Period Weeks   Status New   PT SHORT TERM GOAL #4   Title demonstrate Rt shoulder AROM IR to belt line to assist with tucking in shirt   Time 8   Period Weeks   Status New           PT Long Term Goals - 06/26/15 0847    PT LONG TERM  GOAL #1   Title be independent in advanced HEP   Time 8   Period Weeks   Status New   PT LONG TERM GOAL #2   Title reduce FOTO to <  or = to 38% limitation   PT LONG TERM GOAL #3   Title demonstrate Rt shoulder AROM flexion to > or = to 120 degrees to improve ovehread use   Time 8   Period Weeks   Status New   PT LONG TERM GOAL #4   Title demonstrate 4/5 Rt shoulder strength to improve use with putting on seat belt and tucking in shirt independently   Time 8   Period Weeks   Status New   PT LONG TERM GOAL #5   Title report < or = to 4/10 Rt shoulder pain with use to improve ADLs and allow for return to swimming this Summer   Time 8   Period Weeks   Status New               Plan - July 17, 2015 0919    Clinical Impression Statement Pt is a Rt hand dominant female who presents to PT with 1-2 history of Rt shoulder pain and recent onset of Lt shoulder pain.  Pt has had loss of strength with use overhead of the Rt shoulder.  Pt demonstrates limited and painful Rt shoulder AROM/PROM and strength especially with use overhead and painful and limited Lt shoulder AROM.  Pt reports 8-10/10 Rt shoulder pain and limited ability to independently buckle seat belt, reach overhead and tuck in her shirt.  Pt will benefit from skilled PT forLt and Rt shoulder AROM, gentle strength, manual and pain management to improve use and allow for swimming this Summer.     Pt will benefit from skilled therapeutic intervention in order to improve on the following deficits Pain;Postural dysfunction;Decreased strength;Impaired flexibility;Decreased activity tolerance;Decreased range of motion;Decreased endurance;Impaired UE functional use   Rehab Potential Good   PT Frequency 2x / week   PT Duration 8 weeks   PT Treatment/Interventions ADLs/Self Care Home Management;Cryotherapy;Electrical Stimulation;Iontophoresis 66m/ml Dexamethasone;Moist Heat;Therapeutic exercise;Therapeutic activities;Ultrasound;Neuromuscular  re-education;Patient/family education;Manual techniques;Dry needling;Passive range of motion   PT Next Visit Plan gentle Rt shoulder AROM/PROM/strength, Lt shoulder ROM and strength, manual and modalities for pain and motion   Consulted and Agree with Plan of Care Patient          G-Codes - [id number]    Functional Assessment Tool Used FOTO: 65% limitation   Functional Limitation Other PT primary   Other PT Primary Current Status (([id number] At least 60 percent but less than 80 percent impaired, limited or restricted   Other PT Primary Goal Status (([id number] At least 20 percent but less than 40 percent impaired, limited or restricted       Problem List Patient Active Problem List   Diagnosis Date Noted  . Essential tremor 05/23/2013    KSigurd Sos PT 02017/04/269:42 AM  [facility] [address] [place] [address]Phone: [phone]  Fax:  [phone] Name: JChade PitnerMRN: [id number] of Birth: [birth year]

## 2015-07-04 ENCOUNTER — Ambulatory Visit: Payer: Medicare Other

## 2015-07-09 ENCOUNTER — Ambulatory Visit: Payer: Medicare Other

## 2015-07-09 DIAGNOSIS — M25511 Pain in right shoulder: Secondary | ICD-10-CM

## 2015-07-09 DIAGNOSIS — M25512 Pain in left shoulder: Secondary | ICD-10-CM

## 2015-07-09 DIAGNOSIS — M6281 Muscle weakness (generalized): Secondary | ICD-10-CM

## 2015-07-09 NOTE — Patient Instructions (Signed)
KEEP HEAD IN NEUTRAL AND SHOULDERS DOWN AND RELAXED   Hold tubing in right hand, arm forward. Pull arm back, elbow straight. Repeat __10__ times per set. Do __2__ sets per session. Do _1-2___ sessions per day.  Copyright  VHI. All rights reserved.     With resistive band anchored in door, grasp both ends. Keeping elbows bent, pull back, squeezing shoulder blades together. Hold _3__ seconds. Repeat _2x10___ times. Do _1-2___ sessions per day.  http://[email or web address].exer.us/98   [facility] [address] [place], [place] [address] Phone # [phone] Fax [phone]

## 2015-07-09 NOTE — Therapy (Addendum)
[facility] [address] [place], [place], [address] Phone: [phone]   Fax:  [phone]  Physical Therapy Treatment  Patient Details  Name: Alexis Brady MRN: [id number] Date of Birth: 20-May-1940 Referring Provider: Jean Rosenthal, MD  Encounter Date: 07/09/2015      PT End of Session - 07/09/15 0919    Visit Number 2   Number of Visits 10   Date for PT Re-Evaluation 08/21/15   PT Start Time 0848   PT Stop Time 0929   PT Time Calculation (min) 41 min   Activity Tolerance Patient tolerated treatment well   Behavior During Therapy [facility] for tasks assessed/performed      Past Medical History  Diagnosis Date  . Tremor   . Osteopenia   . Depression   . Anemia   . Asthma     only when had bronchitis per pt  . Heart murmur   . Eczema     Past Surgical History  Procedure Laterality Date  . Vocal cord injection      There were no vitals filed for this visit.      Subjective Assessment - 07/09/15 0852    Subjective Pt reports that shoulders feel sore today.  Doing exercises at home.     Currently in Pain? Yes   Pain Score 3    Pain Location Shoulder   Pain Orientation Right;Left   Pain Descriptors / Indicators Aching   Pain Type Chronic pain   Pain Onset More than a month ago   Pain Frequency Intermittent   Aggravating Factors  reaching behind with Rt shoulder, reaching overhead, putting on seatbelt   Pain Relieving Factors not reaching overhead or back            [facility] PT Assessment - 07/09/15 0001    AROM   Right Shoulder Flexion 105 Degrees   Right Shoulder Internal Rotation --  Rt buttock                     [facility] Adult PT Treatment/Exercise - 07/09/15 0001    Exercises   Exercises Shoulder   Shoulder Exercises: Seated   Other Seated Exercises shoulder shrugs and circles x 10 each   Shoulder Exercises: Standing   Flexion Right;5 reps  using finger ladder    Extension Strengthening;Both;20 reps;Theraband   Theraband Level (Shoulder Extension) Level 1 (Yellow)   Row Strengthening;Both;20 reps;Theraband   Theraband Level (Shoulder Row) Level 1 (Yellow)   Shoulder Exercises: Pulleys   Flexion 3 minutes   ABduction 2 minutes   Modalities   Modalities Ultrasound   Ultrasound   Ultrasound Location Rt shoulder   Ultrasound Parameters 1.0 w/cm2 50% pulsed x 8 minutes   Ultrasound Goals Pain                PT Education - 07/09/15 0906    Education provided Yes   Education Details HEP: yellow theraband rows and bil. shoulder extension   Person(s) Educated Patient   Methods Explanation;Demonstration;Handout   Comprehension Verbalized understanding;Returned demonstration          PT Short Term Goals - 07/09/15 0853    PT SHORT TERM GOAL #1   Title be independent in initial HEP   Time 4   Period Weeks   Status On-going   PT SHORT TERM GOAL #2   Title demonstrate Rt shoulder AROM flexion to > or = to 105 degrees to improve use overhead  Time 4   Period Weeks   Status On-going   PT SHORT TERM GOAL #3   Title report 30% less Rt shoulder pain with putting on seat belt and tucking in shirt   Time 4   Period Weeks   Status On-going   PT SHORT TERM GOAL #4   Title demonstrate Rt shoulder AROM IR to belt line to assist with tucking in shirt   Time 8   Period Weeks   Status On-going           PT Long Term Goals - 06/26/15 0847    PT LONG TERM GOAL #1   Title be independent in advanced HEP   Time 8   Period Weeks   Status New   PT LONG TERM GOAL #2   Title reduce FOTO to < or = to 38% limitation   PT LONG TERM GOAL #3   Title demonstrate Rt shoulder AROM flexion to > or = to 120 degrees to improve ovehread use   Time 8   Period Weeks   Status New   PT LONG TERM GOAL #4   Title demonstrate 4/5 Rt shoulder strength to improve use with putting on seat belt and tucking in shirt independently   Time 8   Period Weeks    Status New   PT LONG TERM GOAL #5   Title report < or = to 4/10 Rt shoulder pain with use to improve ADLs and allow for return to swimming this Summer   Time 8   Period Weeks   Status New               Plan - 01-Aug-2015 8099    Clinical Impression Statement Pt with only 1 session after evaluation.  Pt reports that she did her exercises intermittently when she didn't attend PT last week.  Pt with continued up to 10/10 Rt shoulder pain with reaching behind with the Rt shoulder and putting on seatbelt.  Minimal progress toward goals due to limited attendance and compliance with HEP.  Rt shoulder AROM flexion improved to 105 degrees.  Pt will benefit from skilled PT for Lt and Rt shoulder ROM, gentle strength, manual and pain management to imrpove use and to allow for swimming this Summer.     Rehab Potential Good   PT Frequency 2x / week   PT Duration 8 weeks   PT Treatment/Interventions ADLs/Self Care Home Management;Cryotherapy;Electrical Stimulation;Iontophoresis 27m/ml Dexamethasone;Moist Heat;Therapeutic exercise;Therapeutic activities;Ultrasound;Neuromuscular re-education;Patient/family education;Manual techniques;Dry needling;Passive range of motion   PT Next Visit Plan gentle Rt shoulder AROM/PROM/strength, Lt shoulder ROM and strength, manual and modalities for pain and motion   Consulted and Agree with Plan of Care Patient      Patient will benefit from skilled therapeutic intervention in order to improve the following deficits and impairments:  Pain, Postural dysfunction, Decreased strength, Impaired flexibility, Decreased activity tolerance, Decreased range of motion, Decreased endurance, Impaired UE functional use  Visit Diagnosis: Pain in right shoulder  Pain in left shoulder  Muscle weakness (generalized)     Problem List Patient Active Problem List   Diagnosis Date Noted  . Essential tremor 05/23/2013    KSigurd Sos PT 005/11/179:21 AM G-codes: Other  PT primary category Goal status: CJ D/c status: CL  PHYSICAL THERAPY DISCHARGE SUMMARY  Visits from Start of Care: 2  Current functional level related to goals / functional outcomes: Pt called to cancel all appointments due to co-pay concerns.  See above for most current status.  Remaining deficits: See above for current status and deficits.   Education / Equipment: HEP, posture Plan: Patient agrees to discharge.  Patient goals were not met. Patient is being discharged due to financial reasons.  ?????    Sigurd Sos, PT 07/10/2015 11:28 AM [facility] [address], [place], [place], [address] Phone: [phone]   Fax:  [phone]  Name: Meilani Edmundson MRN: [id number] Date of Birth: 08-02-1940

## 2015-07-11 ENCOUNTER — Ambulatory Visit: Payer: Medicare Other | Admitting: Physical Therapy

## 2015-07-16 ENCOUNTER — Encounter: Payer: Medicare Other | Admitting: Physical Therapy

## 2015-07-25 ENCOUNTER — Encounter: Payer: Medicare Other | Admitting: Physical Therapy

## 2015-07-30 ENCOUNTER — Encounter: Payer: Medicare Other | Admitting: Physical Therapy

## 2015-08-02 ENCOUNTER — Ambulatory Visit (INDEPENDENT_AMBULATORY_CARE_PROVIDER_SITE_OTHER): Payer: Medicare Other | Admitting: Neurology

## 2015-08-02 ENCOUNTER — Encounter: Payer: Self-pay | Admitting: Neurology

## 2015-08-02 VITALS — BP 142/70 | HR 75 | Ht 61.5 in | Wt 186.0 lb

## 2015-08-02 DIAGNOSIS — G250 Essential tremor: Secondary | ICD-10-CM | POA: Diagnosis not present

## 2015-08-02 MED ORDER — TOPIRAMATE 100 MG PO TABS: 100.0000 mg | ORAL_TABLET | Freq: Every day | ORAL | Status: DC

## 2015-08-02 NOTE — Progress Notes (Signed)
Subjective:    Alexis Brady was seen in consultation in the movement disorder clinic at the request of Marton Redwood, MD.  The evaluation is for tremor.  Tremor started in the hands about 15 years ago.  It was in the bilateral hands, L greater than R.  She was seen in [place] for the tremor.  It got worse over the last 2 years.  She is now notices voice and head tremor.  Her signature and writing bothers her the most now.  she does admit that the tremor bothers her family more than it bothers her and her children question whether she has PD.   She has trouble flipping pancakes.  She was recently tried on primidone 50 mg; she took it for 2 months and it didn't seem to help so she d/c it.  She wasn't sure that she liked the way that it made her feel. There is a family hx of tremor in her older sister.    03/01/15 update:  The patient is following up today.  I have not seen her in almost 2 years.  She has a history of essential tremor.  Last visit, she opted to take no medication.  She called me a few days after I saw her last and asked me about trying some type of vitamin for essential tremor, which I did not recommend.  We talked about Topamax last visit and ultimately she decided not to try it.  She had tried low-dose primidone in the past and did not find it effective and thought it made her a little dizzy.  She is not a beta blocker candidate because of asthma.  She does tell me that her PCP gave her a medication but it made her sleepy; however, she has no idea what that medication was.  Upon naming some, she thinks that it was primidone again.  She is having more trouble with vocal tremor and had some head tremor as well.  She is spilling soup and coffee and can barely read her handwriting.  Her handwriting is actually worse in the morning than the evening.  She just started albuterol and it has not affected tremor.    08/02/15 update:  The patient is following up today regarding essential tremor.  I  last saw her on December 9, at which point we started her on Topamax for essential tremor.  On December 27, she went to the emergency room with right flank pain.  The CT was negative for a stone, but suspicious for a recently passed stone.  She has been good since then.  She has noted more tremor in the voice and isn't sure if that is progression of the disease but never really noted much vocal tremor before.  She doesn't think that it has helped hand tremor that much but has been very happy with weight loss.  The topamax has caused her to have a metallic taste in the mouth.     Current/Previously tried tremor medications: primidone (low dose, no help but she didn't like the way it made her feel - little dizzy)  Current medications that may exacerbate tremor:  Albuterol but almost never uses it  Outside reports reviewed: historical medical records and referral letter/letters.  No Known Allergies  Current Outpatient Prescriptions on File Prior to Visit  Medication Sig Dispense Refill  . albuterol (PROVENTIL HFA;VENTOLIN HFA) 108 (90 BASE) MCG/ACT inhaler Inhale into the lungs every 6 (six) hours as needed for wheezing or shortness of breath.    Marland Kitchen  calcium citrate-vitamin D (CITRACAL+D) 315-200 MG-UNIT per tablet Take 1 tablet by mouth 2 (two) times daily.    . cholecalciferol (VITAMIN D) 400 UNITS TABS tablet Take 400 Units by mouth daily.    Marland Kitchen co-enzyme Q-10 30 MG capsule Take 100 mg by mouth daily.    Marland Kitchen ibuprofen (ADVIL,MOTRIN) 200 MG tablet Take 200-600 mg by mouth every 6 (six) hours as needed for headache, mild pain or moderate pain.     . Multiple Vitamin (MULTIVITAMIN) tablet Take 1 tablet by mouth daily.    Marland Kitchen topiramate (TOPAMAX) 100 MG tablet Take 1 tablet (100 mg total) by mouth daily. 30 tablet 3   No current facility-administered medications on file prior to visit.    Past Medical History  Diagnosis Date  . Tremor   . Osteopenia   . Depression   . Anemia   . Asthma     only  when had bronchitis per pt  . Heart murmur   . Eczema     Past Surgical History  Procedure Laterality Date  . Vocal cord injection      Social History   Social History  . Marital Status: Single    Spouse Name: N/A  . Number of Children: N/A  . Years of Education: N/A   Occupational History  . Not on file.   Social History Main Topics  . Smoking status: Former Smoker    Quit date: 05/16/1983  . Smokeless tobacco: Not on file  . Alcohol Use: No  . Drug Use: No  . Sexual Activity: Not on file   Other Topics Concern  . Not on file   Social History Narrative    Family Status  Relation Status Death Age  . Mother Deceased 82    CVA  . Sister Alive     Diabetes, HTN, Fibromyalgia  . Son Alive     healthy  . Son Deceased 71    MVA  . Father Deceased 37    CHF, MI    Review of Systems A complete 10 system ROS was obtained and was negative apart from what is mentioned.   Objective:   VITALS:   Filed Vitals:   08/02/15 0949  BP: 142/70  Pulse: 75  Height: 5' 1.5" (1.562 m)  Weight: 186 lb (84.369 kg)   Wt Readings from Last 3 Encounters:  08/02/15 186 lb (84.369 kg)  03/01/15 195 lb (88.451 kg)  05/23/13 209 lb (94.802 kg)    Gen:  Appears stated age and in NAD. HEENT:  Normocephalic, atraumatic. The mucous membranes are moist. The superficial temporal arteries are without ropiness or tenderness. Cardiovascular: Regular rate and rhythm with 3/6 SEM (radiates to bilateral carotids, L more than R) Lungs: Clear to auscultation bilaterally. Neck: There are no carotid bruits noted bilaterally.  NEUROLOGICAL:  Orientation:  The patient is alert and oriented x 3.  Recent and remote memory are intact.  Attention span and concentration are normal.  Able to name objects and repeat without trouble.  Fund of knowledge is appropriate Cranial nerves: There is good facial symmetry. The pupils are equal round and reactive to light bilaterally. Fundoscopic exam reveals  clear disc margins bilaterally. Extraocular muscles are intact and visual fields are full to confrontational testing. Speech is fluent and clear but she does have vocal tremor. Soft palate rises symmetrically and there is no tongue deviation. Hearing is intact to conversational tone. Tone: Tone is good throughout. Sensation: Sensation is intact to light touch throughout  Coordination:  The patient has no dysdiadichokinesia or dysmetria. Motor: Strength is 5/5 in the bilateral upper and lower extremities.  Shoulder shrug is equal bilaterally.  There is no pronator drift.  There are no fasciculations noted. Gait and Station: The patient is able to ambulate without difficulty. The patient has difficulty ambulating in a tandem fashion.  The patient is able to stand in the Romberg position.   MOVEMENT EXAM: Tremor:  There is mild tremor in the UE, noted most significantly with action. Head tremor is in the "no" direction today when talking to me but it is not constant.  There is no null point.  Archimedes spirals are better on the L than in the past and she has no trouble getting the hand on the tablet, as she did in the past.       Assessment/Plan:   1.  Essential Tremor.  -This is evidenced by the symmetrical nature and longstanding hx of gradually getting worse.  I don't think that this represents dystonic tremor as there is no null point.  Unfortunately, therefore, botox will not help the neck.  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  Discussed this in detail today as we did at last visit  -Long discussion with the patient.  As above, she had started Topamax on approximately December 9 and on December 27 she went to the emergency room with a possible recently passed kidney stone.  I'm not sure that it would have formed that fast from Topamax, but I did explain to her  that Topamax can increased risks for kidney stones.  Because she wasn't sure it was that helpful (exam showed mild improvement), I recommended we just d/c it but she wants to stay on it for another 3-4 months.  She assured me she would call me and d/c it if had additional stones. 2.  Follow up is anticipated in the next few months, sooner should new neurologic issues arise.   Much greater than 50% of this visit was spent in counseling with the patient and the family.  Total face to face time:  25 min

## 2015-11-06 ENCOUNTER — Ambulatory Visit: Payer: Medicare Other | Admitting: Neurology

## 2015-12-09 ENCOUNTER — Ambulatory Visit: Payer: Medicare Other | Admitting: Neurology

## 2016-01-02 NOTE — Progress Notes (Signed)
Subjective:    Alexis Brady was seen in consultation in the movement disorder clinic at the request of Marton Redwood, MD.  The evaluation is for tremor.  Tremor started in the hands about 15 years ago.  It was in the bilateral hands, L greater than R.  She was seen in [place] for the tremor.  It got worse over the last 2 years.  She is now notices voice and head tremor.  Her signature and writing bothers her the most now.  she does admit that the tremor bothers her family more than it bothers her and her children question whether she has PD.   She has trouble flipping pancakes.  She was recently tried on primidone 50 mg; she took it for 2 months and it didn't seem to help so she d/c it.  She wasn't sure that she liked the way that it made her feel. There is a family hx of tremor in her older sister.    03/01/15 update:  The patient is following up today.  I have not seen her in almost 2 years.  She has a history of essential tremor.  Last visit, she opted to take no medication.  She called me a few days after I saw her last and asked me about trying some type of vitamin for essential tremor, which I did not recommend.  We talked about Topamax last visit and ultimately she decided not to try it.  She had tried low-dose primidone in the past and did not find it effective and thought it made her a little dizzy.  She is not a beta blocker candidate because of asthma.  She does tell me that her PCP gave her a medication but it made her sleepy; however, she has no idea what that medication was.  Upon naming some, she thinks that it was primidone again.  She is having more trouble with vocal tremor and had some head tremor as well.  She is spilling soup and coffee and can barely read her handwriting.  Her handwriting is actually worse in the morning than the evening.  She just started albuterol and it has not affected tremor.    08/02/15 update:  The patient is following up today regarding essential tremor.  I  last saw her on December 9, at which point we started her on Topamax for essential tremor.  On December 27, she went to the emergency room with right flank pain.  The CT was negative for a stone, but suspicious for a recently passed stone.  She has been good since then.  She has noted more tremor in the voice and isn't sure if that is progression of the disease but never really noted much vocal tremor before.  She doesn't think that it has helped hand tremor that much but has been very happy with weight loss.  The topamax has caused her to have a metallic taste in the mouth.     01/03/16 update:  The patient follows up today regarding essential tremor.  She is on topiramate, 100 mg daily.  She has had no further episodes of kidney stones.  She does understand the risk.  She has dropped 13 lbs since last visit but "I feel good."  Appetite is less than it was.  Noted more tremor in the voice lately.  Had tremor is same as last visit.   States that she just had birthday and went to the [facility] and did tubing and white water rafting and  plans to sky dive next year.  Current/Previously tried tremor medications: primidone (low dose, no help but she didn't like the way it made her feel - little dizzy)  Current medications that may exacerbate tremor:  Albuterol but almost never uses it  Outside reports reviewed: historical medical records and referral letter/letters.  No Known Allergies  Current Outpatient Prescriptions on File Prior to Visit  Medication Sig Dispense Refill  . albuterol (PROVENTIL HFA;VENTOLIN HFA) 108 (90 BASE) MCG/ACT inhaler Inhale into the lungs every 6 (six) hours as needed for wheezing or shortness of breath.    . calcium citrate-vitamin D (CITRACAL+D) 315-200 MG-UNIT per tablet Take 1 tablet by mouth 2 (two) times daily.    . cholecalciferol (VITAMIN D) 400 UNITS TABS tablet Take 400 Units by mouth daily.    Marland Kitchen co-enzyme Q-10 30 MG capsule Take 100 mg by mouth daily.    Marland Kitchen ibuprofen  (ADVIL,MOTRIN) 200 MG tablet Take 400 mg by mouth 2 (two) times daily.     . Multiple Vitamin (MULTIVITAMIN) tablet Take 1 tablet by mouth daily.    . vitamin B-12 (CYANOCOBALAMIN) 1000 MCG tablet Take 1,000 mcg by mouth daily.     No current facility-administered medications on file prior to visit.     Past Medical History:  Diagnosis Date  . Anemia   . Asthma    only when had bronchitis per pt  . Depression   . Eczema   . Heart murmur   . Osteopenia   . Tremor     Past Surgical History:  Procedure Laterality Date  . VOCAL CORD INJECTION      Social History   Social History  . Marital status: Single    Spouse name: N/A  . Number of children: N/A  . Years of education: N/A   Occupational History  . Not on file.   Social History Main Topics  . Smoking status: Former Smoker    Quit date: 05/16/1983  . Smokeless tobacco: Not on file  . Alcohol use No  . Drug use: No  . Sexual activity: Not on file   Other Topics Concern  . Not on file   Social History Narrative  . No narrative on file    Family Status  Relation Status  . Mother Deceased at age 13   CVA  . Sister Alive   Diabetes, HTN, Fibromyalgia  . Son Alive   healthy  . Son Deceased at age 3   MVA  . Father Deceased at age 56   CHF, MI    Review of Systems A complete 10 system ROS was obtained and was negative apart from what is mentioned.   Objective:   VITALS:   Vitals:   01/03/16 1308  BP: 120/80  Pulse: 66  Weight: 173 lb (78.5 kg)  Height: 5' 1.5" (1.562 m)   Wt Readings from Last 3 Encounters:  01/03/16 173 lb (78.5 kg)  08/02/15 186 lb (84.4 kg)  03/01/15 195 lb (88.5 kg)    Gen:  Appears stated age and in NAD. HEENT:  Normocephalic, atraumatic. The mucous membranes are moist. The superficial temporal arteries are without ropiness or tenderness. Cardiovascular: Regular rate and rhythm with 3/6 SEM (radiates to bilateral carotids, L more than R) Lungs: Clear to auscultation  bilaterally. Neck: There are no carotid bruits noted bilaterally.  NEUROLOGICAL:  Orientation:  The patient is alert and oriented x 3.  Recent and remote memory are intact.  Attention span and concentration are  normal.  Able to name objects and repeat without trouble.  Fund of knowledge is appropriate Cranial nerves: There is good facial symmetry. The pupils are equal round and reactive to light bilaterally. Fundoscopic exam reveals clear disc margins bilaterally. Extraocular muscles are intact and visual fields are full to confrontational testing. Speech is fluent and clear but she does have vocal tremor. Soft palate rises symmetrically and there is no tongue deviation. Hearing is intact to conversational tone. Tone: Tone is good throughout. Sensation: Sensation is intact to light touch throughout Coordination:  The patient has no dysdiadichokinesia or dysmetria. Motor: Strength is 5/5 in the bilateral upper and lower extremities.  Shoulder shrug is equal bilaterally.  There is no pronator drift.  There are no fasciculations noted. Gait and Station: The patient is able to ambulate without difficulty.   MOVEMENT EXAM: Tremor:  There is mild tremor in the UE, noted most significantly with action. Intermittent head and jaw tremor. There is no null point.       Assessment/Plan:   1.  Essential Tremor.  -This is evidenced by the symmetrical nature and longstanding hx of gradually getting worse.  I don't think that this represents dystonic tremor as there is no null point.  Unfortunately, therefore, botox will not help the neck.  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  Discussed this in detail today as we did at last visit  -Long discussion with the patientAgain today.  She did have a kidney stone a few weeks after starting on Topamax, but she wanted to continue on  the medication, knowing the potential risks. It was refilled today. 2.  Weight loss  -will need to monitor.  If from topamax alone, it should taper off 3.  Follow up is anticipated in the next few months, sooner should new neurologic issues arise.   Much greater than 50% of this visit was spent in counseling with the patient and the family.  Total face to face time:  25 min

## 2016-01-03 ENCOUNTER — Encounter: Payer: Self-pay | Admitting: Neurology

## 2016-01-03 ENCOUNTER — Ambulatory Visit (INDEPENDENT_AMBULATORY_CARE_PROVIDER_SITE_OTHER): Payer: Medicare Other | Admitting: Neurology

## 2016-01-03 VITALS — BP 120/80 | HR 66 | Ht 61.5 in | Wt 173.0 lb

## 2016-01-03 DIAGNOSIS — G250 Essential tremor: Secondary | ICD-10-CM

## 2016-01-03 MED ORDER — TOPIRAMATE 100 MG PO TABS: 100.0000 mg | ORAL_TABLET | Freq: Every day | ORAL | 5 refills | Status: DC

## 2016-01-08 ENCOUNTER — Ambulatory Visit: Payer: Medicare Other | Admitting: Neurology

## 2016-01-24 ENCOUNTER — Encounter: Payer: Self-pay | Admitting: Gastroenterology

## 2016-01-29 ENCOUNTER — Encounter: Payer: Self-pay | Admitting: Gastroenterology

## 2016-02-07 LAB — HEMOCCULT GUIAC POC 1CARD (OFFICE): FECAL OCCULT BLD: POSITIVE — AB

## 2016-02-10 ENCOUNTER — Encounter: Payer: Self-pay | Admitting: Gastroenterology

## 2016-02-19 ENCOUNTER — Encounter (INDEPENDENT_AMBULATORY_CARE_PROVIDER_SITE_OTHER): Payer: Self-pay

## 2016-02-19 ENCOUNTER — Ambulatory Visit (INDEPENDENT_AMBULATORY_CARE_PROVIDER_SITE_OTHER): Payer: Medicare Other | Admitting: Gastroenterology

## 2016-02-19 ENCOUNTER — Encounter: Payer: Self-pay | Admitting: Gastroenterology

## 2016-02-19 VITALS — BP 142/98 | HR 76 | Ht 62.0 in | Wt 162.2 lb

## 2016-02-19 DIAGNOSIS — R194 Change in bowel habit: Secondary | ICD-10-CM | POA: Diagnosis not present

## 2016-02-19 DIAGNOSIS — K921 Melena: Secondary | ICD-10-CM | POA: Insufficient documentation

## 2016-02-19 DIAGNOSIS — R634 Abnormal weight loss: Secondary | ICD-10-CM | POA: Diagnosis not present

## 2016-02-19 DIAGNOSIS — Z1211 Encounter for screening for malignant neoplasm of colon: Secondary | ICD-10-CM

## 2016-02-19 HISTORY — DX: Melena: K92.1

## 2016-02-19 MED ORDER — NA SULFATE-K SULFATE-MG SULF 17.5-3.13-1.6 GM/177ML PO SOLN: ORAL | 0 refills | Status: DC

## 2016-02-19 NOTE — Progress Notes (Signed)
02/19/2016 Kasen Sako [id number] 12/19/1940   HISTORY OF PRESENT ILLNESS:  This is a very pleasant 75 year old female who is previously known to Dr. Fuller Plan for colonoscopy in October 2007 at which time she was found to have diverticulosis and inflammatory polyps removed. Recall was recommended in 10 years from that time. She did receive a recall letter from [place], but also reports that around the same time as receiving the letter she had a change in bowel habits. She reports very thin, pencil-like stools that she is extremely concerned about. Has been like this for about 2 months.  She thinks that she saw blood in her stool on one occasion and she actually had a positive hemosure at her PCP's office. They have also been concerned about her weight loss. She lost about 30 pounds over the past year, 20 of those pounds since June and she reports 7 of those pounds just this past month. This is thought somewhat in part to be due to her Topamax use and decreased appetite with that. Also her husband has had a lot of health issues that she's been under a lot of stress with recently. She denies any heartburn, reflux, indigestion, dysphagia, nausea, vomiting, or other upper GI symptoms. Her hemoglobin is low-normal at 11.6 g. MCV is low at 64.8, but ferritin is normal as well as remaining iron studies being within normal limits.  PCP considering CT scan chest/abdomen/pelvis if weight loss continues.   Past Medical History:  Diagnosis Date  . Anemia   . Asthma    only when had bronchitis per pt  . Depression   . Eczema   . Heart murmur   . Obesity   . Osteopenia   . Osteoporosis   . Restrictive lung disease   . Tremor    Past Surgical History:  Procedure Laterality Date  . VOCAL CORD INJECTION      reports that she quit smoking about 32 years ago. She has never used smokeless tobacco. She reports that she does not drink alcohol or use drugs. family history includes CVA in her mother;  Diabetes in her sister; Fibromyalgia in her sister; Heart attack in her father; Heart failure in her father; Hypertension in her sister. No Known Allergies    Outpatient Encounter Prescriptions as of 02/19/2016  Medication Sig  . albuterol (PROVENTIL HFA;VENTOLIN HFA) 108 (90 BASE) MCG/ACT inhaler Inhale into the lungs every 6 (six) hours as needed for wheezing or shortness of breath.  Marland Kitchen aspirin EC 81 MG tablet Take 81 mg by mouth daily.  . calcium citrate-vitamin D (CITRACAL+D) 315-200 MG-UNIT per tablet Take 1 tablet by mouth 2 (two) times daily.  . cholecalciferol (VITAMIN D) 400 UNITS TABS tablet Take 400 Units by mouth daily.  Marland Kitchen co-enzyme Q-10 30 MG capsule Take 100 mg by mouth daily.  Marland Kitchen ibuprofen (ADVIL,MOTRIN) 200 MG tablet Take 400 mg by mouth 2 (two) times daily.   . Melatonin 3 MG TABS Take by mouth at bedtime.  . Multiple Vitamin (MULTIVITAMIN) tablet Take 1 tablet by mouth daily.  Marland Kitchen topiramate (TOPAMAX) 100 MG tablet Take 1 tablet (100 mg total) by mouth daily.  . vitamin B-12 (CYANOCOBALAMIN) 1000 MCG tablet Take 1,000 mcg by mouth daily.  . Na Sulfate-K Sulfate-Mg Sulf (SUPREP BOWEL PREP KIT) 17.5-3.13-1.6 GM/180ML SOLN Use as directed   No facility-administered encounter medications on file as of 02/19/2016.      REVIEW OF SYSTEMS  : All other systems reviewed and negative except  where noted in the History of Present Illness.   PHYSICAL EXAM: BP (!) 142/98   Pulse 76   Ht _0  (1.575 m)   Wt 162 lb 3 oz (73.6 kg)   BMI 29.66 kg/m  General: Well developed white female in no acute distress; tremulous voice Head: Normocephalic and atraumatic Eyes:  Sclerae anicteric, conjunctiva pink. Ears: Normal auditory acuity Lungs: Clear throughout to auscultation.  No increased WOB. Heart: Regular rate and rhythm Abdomen: Soft, non-distended.  BS present.  Non-tender. Rectal:  Will be done at the time of colonoscopy. Musculoskeletal: Symmetrical with no gross deformities   Skin: No lesions on visible extremities Extremities: No edema  Neurological: Alert oriented x 4, grossly non-focal Psychological:  Alert and cooperative. Normal mood and affect  ASSESSMENT AND PLAN: -75 year old female with recent change in bowel habits with pencil/thin stools and positive Hemosure. -Weight loss:  30 pounds over the past year.  Somewhat due to Topamax and also a lot of worry and health issues with her husband. -Screening colonoscopy:  Last colonoscopy 10 years ago.  *Will schedule for colonoscopy with Dr. Fuller Plan.  The risks, benefits, and alternatives to colonoscopy were discussed with the patient and she consents to proceed.    CC:  Marton Redwood, MD

## 2016-02-19 NOTE — Patient Instructions (Signed)
  You have been scheduled for a colonoscopy. Please follow written instructions given to you at your visit today.  Please pick up your prep supplies at the pharmacy within the next 1-3 days. If you use inhalers (even only as needed), please bring them with you on the day of your procedure. Your physician has requested that you go to www.[email or web address].com and enter the access code given to you at your visit today. This web site gives a general overview about your procedure. However, you should still follow specific instructions given to you by our office regarding your preparation for the procedure.    I appreciate the opportunity to care for you.

## 2016-02-21 ENCOUNTER — Encounter: Payer: Self-pay | Admitting: Gastroenterology

## 2016-03-02 ENCOUNTER — Other Ambulatory Visit: Payer: Self-pay | Admitting: Gastroenterology

## 2016-03-02 MED ORDER — METOCLOPRAMIDE HCL 5 MG PO TABS: 5.0000 mg | ORAL_TABLET | Freq: Four times a day (QID) | ORAL | 0 refills | Status: DC

## 2016-03-02 NOTE — Progress Notes (Unsigned)
Patient called with complaints that she started vomiting after drinking the first half of the suprep. She is moving her bowels and is having liquid brown stool. Advised her to take 53m Reglan now and complete the rest of the prep for tonight. Take additional 510mReglan tomorrow AM prior to drinking the second half. Just in case if she is unable to tolerate additional suprep, also gave instructions to switch to Miralax with Gatorade prep  K. VeDenzil Magnuson MD

## 2016-03-03 ENCOUNTER — Encounter: Payer: Self-pay | Admitting: Gastroenterology

## 2016-03-03 ENCOUNTER — Ambulatory Visit (AMBULATORY_SURGERY_CENTER): Payer: Medicare Other | Admitting: Gastroenterology

## 2016-03-03 VITALS — BP 121/66 | HR 68 | Temp 98.7°F | Resp 28 | Ht 62.0 in | Wt 162.0 lb

## 2016-03-03 DIAGNOSIS — R195 Other fecal abnormalities: Secondary | ICD-10-CM | POA: Diagnosis present

## 2016-03-03 DIAGNOSIS — R194 Change in bowel habit: Secondary | ICD-10-CM | POA: Diagnosis not present

## 2016-03-03 DIAGNOSIS — K529 Noninfective gastroenteritis and colitis, unspecified: Secondary | ICD-10-CM | POA: Diagnosis not present

## 2016-03-03 DIAGNOSIS — K6289 Other specified diseases of anus and rectum: Secondary | ICD-10-CM | POA: Diagnosis not present

## 2016-03-03 MED ORDER — SODIUM CHLORIDE 0.9 % IV SOLN: 500.0000 mL | INTRAVENOUS | Status: DC

## 2016-03-03 NOTE — Op Note (Signed)
[facility] Patient Name: Alexis Brady Procedure Date: 03/03/2016 9:28 AM MRN: [id number] Endoscopist: Ladene Artist , MD Age: 75 Referring MD:  Date of Birth: 09/24/1940 Gender: Female Account #: [id number] Procedure:                Colonoscopy Indications:              Positive fecal immunochemical test, Change in bowel                            habits Medicines:                Monitored Anesthesia Care Procedure:                Pre-Anesthesia Assessment:                           - Prior to the procedure, a History and Physical                            was performed, and patient medications and                            allergies were reviewed. The patient's tolerance of                            previous anesthesia was also reviewed. The risks                            and benefits of the procedure and the sedation                            options and risks were discussed with the patient.                            All questions were answered, and informed consent                            was obtained. Prior Anticoagulants: The patient has                            taken no previous anticoagulant or antiplatelet                            agents. ASA Grade Assessment: II - A patient with                            mild systemic disease. After reviewing the risks                            and benefits, the patient was deemed in                            satisfactory condition to undergo the procedure.  After obtaining informed consent, the colonoscope                            was passed under direct vision. Throughout the                            procedure, the patient's blood pressure, pulse, and                            oxygen saturations were monitored continuously. The                            Model PCF-H190L [id number]) scope was introduced                            through the anus and advanced to the the  cecum,                            identified by appendiceal orifice and ileocecal                            valve. The ileocecal valve, appendiceal orifice,                            and rectum were photographed. The quality of the                            bowel preparation was good. The colonoscopy was                            performed without difficulty. The patient tolerated                            the procedure well. Scope In: 9:44:41 AM Scope Out: 9:56:29 AM Scope Withdrawal Time: 0 hours 9 minutes 56 seconds  Total Procedure Duration: 0 hours 11 minutes 48 seconds  Findings:                 The perianal and digital rectal examinations were                            normal.                           Many small-mouthed diverticula were found in the                            sigmoid colon and descending colon.                           Localized mild inflammation characterized by                            congestion (edema), erythema, friability and  granularity was found in the rectum. Biopsies were                            taken with a cold forceps for histology.                           Localized moderate inflammation characterized by                            congestion (edema), erythema, friability,                            granularity and loss of vascularity was found in                            the sigmoid colon. Biopsies were taken with a cold                            forceps for histology.                           The exam was otherwise without abnormality on                            direct and retroflexion views. Complications:            No immediate complications. Estimated blood loss:                            None. Estimated Blood Loss:     Estimated blood loss: none. Impression:               - Diverticulosis in the sigmoid colon and in the                            descending colon.                           -  Localized mild inflammation was found in the                            rectum. Biopsied.                           - Localized moderate inflammation was found in the                            sigmoid colon. Biopsied.                           - The examination was otherwise normal on direct                            and retroflexion views. Recommendation:           - Repeat colonoscopy date to be determined after  pending pathology results are reviewed for                            surveillance based on pathology results.                           - Patient has a contact number available for                            emergencies. The signs and symptoms of potential                            delayed complications were discussed with the                            patient. Return to normal activities tomorrow.                            Written discharge instructions were provided to the                            patient.                           - Resume previous diet.                           - Continue present medications.                           - Await pathology results.                           - Return to GI office in 1 month. Ladene Artist, MD 03/03/2016 10:00:36 AM This report has been signed electronically.

## 2016-03-03 NOTE — Patient Instructions (Signed)
YOU HAD AN ENDOSCOPIC PROCEDURE TODAY AT [facility]:   Refer to the procedure report that was given to you for any specific questions about what was found during the examination.  If the procedure report does not answer your questions, please call your gastroenterologist to clarify.  If you requested that your care partner not be given the details of your procedure findings, then the procedure report has been included in a sealed envelope for you to review at your convenience later.  YOU SHOULD EXPECT: Some feelings of bloating in the abdomen. Passage of more gas than usual.  Walking can help get rid of the air that was put into your GI tract during the procedure and reduce the bloating. If you had a lower endoscopy (such as a colonoscopy or flexible sigmoidoscopy) you may notice spotting of blood in your stool or on the toilet paper. If you underwent a bowel prep for your procedure, you may not have a normal bowel movement for a few days.  Please Note:  You might notice some irritation and congestion in your nose or some drainage.  This is from the oxygen used during your procedure.  There is no need for concern and it should clear up in a day or so.  SYMPTOMS TO REPORT IMMEDIATELY:   Following lower endoscopy (colonoscopy or flexible sigmoidoscopy):  Excessive amounts of blood in the stool  Significant tenderness or worsening of abdominal pains  Swelling of the abdomen that is new, acute  Fever of 100F or higher  For urgent or emergent issues, a gastroenterologist can be reached at any hour by calling [phone].   DIET:  We do recommend a small meal at first, but then you may proceed to your regular diet.  Drink plenty of fluids but you should avoid alcoholic beverages for 24 hours.  ACTIVITY:  You should plan to take it easy for the rest of today and you should NOT DRIVE or use heavy machinery until tomorrow (because of the sedation medicines used during the test).     FOLLOW Up: Our staff will call the number listed on your records the next business day following your procedure to check on you and address any questions or concerns that you may have regarding the information given to you following your procedure. If we do not reach you, we will leave a message.  However, if you are feeling well and you are not experiencing any problems, there is no need to return our call.  We will assume that you have returned to your regular daily activities without incident.  If any biopsies were taken you will be contacted by phone or by letter within the next 1-3 weeks.  Please call us at [phone] if you have not heard about the biopsies in 3 weeks.    SIGNATURES/CONFIDENTIALITY: You and/or your care partner have signed paperwork which will be entered into your electronic medical record.  These signatures attest to the fact that that the information above on your After Visit Summary has been reviewed and is understood.  Full responsibility of the confidentiality of this discharge information lies with you and/or your care-partner.  Diverticulosis (handouts given) Repeat colonoscopy depends on pathology results Office visit in one month. Office will call and set up follow up office visit.

## 2016-03-03 NOTE — Progress Notes (Signed)
Report to PACU, RN, vss, BBS= Clear.  

## 2016-03-03 NOTE — Progress Notes (Signed)
Called to room to assist during endoscopic procedure.  Patient ID and intended procedure confirmed with present staff. Received instructions for my participation in the procedure from the performing physician.  

## 2016-03-04 ENCOUNTER — Telehealth: Payer: Self-pay | Admitting: *Deleted

## 2016-03-04 NOTE — Telephone Encounter (Signed)
  Follow up Call-  Call back number 03/03/2016  Post procedure Call Back phone  # [phone]  Permission to leave phone message Yes  Some recent data might be hidden     Patient questions:  Do you have a fever, pain , or abdominal swelling? No. Pain Score  0 *  Have you tolerated food without any problems? Yes.    Have you been able to return to your normal activities? Yes.    Do you have any questions about your discharge instructions: Diet   No. Medications  No. Follow up visit  No.  Do you have questions or concerns about your Care? No.  Actions: * If pain score is 4 or above: No action needed, pain <4.  Diverticulosis handout at front desk 4 th floor for patient to pick up per patient's request.

## 2016-03-12 ENCOUNTER — Telehealth: Payer: Self-pay | Admitting: Gastroenterology

## 2016-03-12 ENCOUNTER — Encounter: Payer: Self-pay | Admitting: Gastroenterology

## 2016-03-12 NOTE — Telephone Encounter (Signed)
Pathology showed left sided colitis c/w ulcerative colitis.  Begin Lialda 2.4g daily and Uceris 9 mg daily Schedule office appt if she doesn't already have one in 4-6 week.

## 2016-03-12 NOTE — Telephone Encounter (Signed)
Path result was sent to Dr Fuller Plan to review, also, what medication is she suppose to be taking?  She states she has not been on anything but was told she would be starting something new.

## 2016-03-13 ENCOUNTER — Other Ambulatory Visit: Payer: Self-pay

## 2016-03-13 ENCOUNTER — Telehealth: Payer: Self-pay

## 2016-03-13 MED ORDER — BUDESONIDE 9 MG PO TB24: 9.0000 mg | ORAL_TABLET | Freq: Every day | ORAL | 3 refills | Status: DC

## 2016-03-13 MED ORDER — MESALAMINE 1.2 G PO TBEC: 2.4000 g | DELAYED_RELEASE_TABLET | Freq: Every day | ORAL | 3 refills | Status: DC

## 2016-03-13 NOTE — Telephone Encounter (Signed)
Voice mail has not been set up

## 2016-03-13 NOTE — Telephone Encounter (Signed)
Scripts sent to pharmacy. Pt scheduled to see Dr. Fuller Plan 04/27/16 @3 :45pm. Left message for pt to call back.

## 2016-03-13 NOTE — Telephone Encounter (Signed)
Spoke with pt and she is aware of appt, path results and knows to pick up meds from pharmacy.

## 2016-03-17 NOTE — Telephone Encounter (Signed)
Pt appt was moved 04/29/16 and pt aware.

## 2016-03-20 ENCOUNTER — Telehealth: Payer: Self-pay | Admitting: Gastroenterology

## 2016-03-20 NOTE — Telephone Encounter (Signed)
Compounded budesonide 5 mg and 10 mg is available at a [facility]. So budesonide 10 mg daily.  [facility] has the information and others may as well.

## 2016-03-20 NOTE — Telephone Encounter (Signed)
Dr. Fuller Plan, the Alexis Brady is over 500 dollars. I gave patient some samples for her to take it for 10 days. Is there something else you want her to be switched to?

## 2016-03-24 MED ORDER — AMBULATORY NON FORMULARY MEDICATION: 2 refills | Status: DC

## 2016-03-24 NOTE — Telephone Encounter (Signed)
Prescription faxed to [facility]. Patient notified that prescription has been sent and to continue budesonide until appointment in February. Patient verbalized understanding.

## 2016-03-25 ENCOUNTER — Other Ambulatory Visit: Payer: Self-pay | Admitting: Internal Medicine

## 2016-03-25 DIAGNOSIS — Z1231 Encounter for screening mammogram for malignant neoplasm of breast: Secondary | ICD-10-CM

## 2016-03-27 ENCOUNTER — Telehealth: Payer: Self-pay | Admitting: Gastroenterology

## 2016-03-27 NOTE — Telephone Encounter (Signed)
Left a message for patient to return my call. 

## 2016-03-30 NOTE — Telephone Encounter (Signed)
Patient states she went to [facility] and picked up the compounded Uceris and it was 100 dollars. Patient states she got a letter in the mail that states other drugs that are covered to replace Uceris is Lialda, sulfasalzine and Apriso. Informed patient she is already taking Lialda and Uceris works differently and she needs to be on both medications. Patient states she went ahead and picked up Uceris and will go ahead and start it but just wanted to discuss and find out if there is anything cheaper. Informed patient to keep her follow-up visit with Dr. Fuller Plan to discuss. Patient verbalized understanding.

## 2016-04-03 ENCOUNTER — Encounter: Payer: Medicare Other | Admitting: Gastroenterology

## 2016-04-17 ENCOUNTER — Other Ambulatory Visit: Payer: Self-pay | Admitting: Gastroenterology

## 2016-04-21 ENCOUNTER — Ambulatory Visit: Payer: Medicare Other

## 2016-04-27 ENCOUNTER — Ambulatory Visit: Payer: Medicare Other | Admitting: Gastroenterology

## 2016-04-29 ENCOUNTER — Ambulatory Visit: Payer: Medicare Other | Admitting: Gastroenterology

## 2016-06-03 ENCOUNTER — Other Ambulatory Visit: Payer: Self-pay | Admitting: Internal Medicine

## 2016-06-03 DIAGNOSIS — I350 Nonrheumatic aortic (valve) stenosis: Secondary | ICD-10-CM

## 2016-06-08 ENCOUNTER — Encounter: Payer: Self-pay | Admitting: Gastroenterology

## 2016-06-08 ENCOUNTER — Ambulatory Visit (INDEPENDENT_AMBULATORY_CARE_PROVIDER_SITE_OTHER): Payer: Medicare Other | Admitting: Gastroenterology

## 2016-06-08 ENCOUNTER — Encounter (INDEPENDENT_AMBULATORY_CARE_PROVIDER_SITE_OTHER): Payer: Self-pay

## 2016-06-08 VITALS — BP 118/62 | HR 80 | Resp 16 | Ht 61.0 in | Wt 158.2 lb

## 2016-06-08 DIAGNOSIS — K5130 Ulcerative (chronic) rectosigmoiditis without complications: Secondary | ICD-10-CM

## 2016-06-08 NOTE — Patient Instructions (Signed)
Stop taking your budesonide.   Continue your Lialda.   Thank you for choosing me and [facility].  Pricilla Riffle. Dagoberto Ligas., MD., Marval Regal

## 2016-06-08 NOTE — Progress Notes (Signed)
    History of Present Illness: This is a 76 year old female with recently diagnosed UC, proctosigmoiditis. She was started on Lialda and budesonide in December. She has no gastrointestinal complaints. She states occasionally the caliber of her stool varies sometimes it's normal sometimes it's a little thinner..  Colonoscopy 02/2016: - Diverticulosis in the sigmoid colon and in the descending colon. - Localized mild inflammation was found in the rectum. Biopsied. - Localized moderate inflammation was found in the sigmoid colon. Biopsied. - The examination was otherwise normal on direct and retroflexion views.  1. Surgical [P], right colon biopsy -BENIGN COLONIC MUCOSA WITH NO HISTOPATHOLOGIC ABNORMALITY. -NO EVIDENCE OF LYMPHOCYTIC OR COLLAGENOUS COLITIS, INFLAMMATORY BOWEL DISEASE OR MALIGNANCY. -SEE COMMENT. 2. Surgical [P], sigmoid bx (colitis) -MARKED CHRONIC ACTIVE COLITIS. -NEGATIVE FOR DYSPLASIA OR MALIGNANCY. -SEE COMMENT. 3. Surgical [P], rectum BX (proctitis) -MARKED CHRONIC ACTIVE PROCTITIS. -NEGATIVE FOR DYSPLASIA OR MALIGNANCY. -SEE COMMENT.   Current Medications, Allergies, Past Medical History, Past Surgical History, Family History and Social History were reviewed in [employer] record.  Physical Exam: General: Well developed, well nourished, no acute distress Head: Normocephalic and atraumatic Eyes:  sclerae anicteric, EOMI Ears: Normal auditory acuity Mouth: No deformity or lesions Lungs: Clear throughout to auscultation Heart: Regular rate and rhythm; no murmurs, rubs or bruits Abdomen: Soft, non tender and non distended. No masses, hepatosplenomegaly or hernias noted. Normal Bowel sounds Musculoskeletal: Symmetrical with no gross deformities  Pulses:  Normal pulses noted Extremities: No clubbing, cyanosis, edema or deformities noted Neurological: Alert oriented x 4, grossly nonfocal Psychological:  Alert and cooperative. Normal mood and  affect  Assessment and Recommendations:  1. Ulcerative proctosigmoiditis. We discussed the disease and its long-term management. I addressed her questions to her satisfaction. Discontinue budesonide and continue Lialda 2.4 g daily. Return office visit one year.  I spent 15 minutes of face-to-face time with the patient. Greater than 50% of the time was spent counseling and coordinating care.

## 2016-06-16 ENCOUNTER — Ambulatory Visit (HOSPITAL_COMMUNITY): Payer: Medicare Other | Attending: Cardiology

## 2016-06-16 ENCOUNTER — Other Ambulatory Visit: Payer: Self-pay

## 2016-06-16 DIAGNOSIS — I350 Nonrheumatic aortic (valve) stenosis: Secondary | ICD-10-CM | POA: Diagnosis not present

## 2016-06-16 DIAGNOSIS — I348 Other nonrheumatic mitral valve disorders: Secondary | ICD-10-CM | POA: Insufficient documentation

## 2016-06-16 DIAGNOSIS — I77810 Thoracic aortic ectasia: Secondary | ICD-10-CM | POA: Diagnosis not present

## 2016-06-22 ENCOUNTER — Encounter: Payer: Self-pay | Admitting: Cardiology

## 2016-06-22 ENCOUNTER — Ambulatory Visit (INDEPENDENT_AMBULATORY_CARE_PROVIDER_SITE_OTHER): Payer: Medicare Other | Admitting: Cardiology

## 2016-06-22 VITALS — BP 150/82 | HR 74 | Ht 61.0 in | Wt 159.0 lb

## 2016-06-22 DIAGNOSIS — I10 Essential (primary) hypertension: Secondary | ICD-10-CM

## 2016-06-22 DIAGNOSIS — G250 Essential tremor: Secondary | ICD-10-CM

## 2016-06-22 DIAGNOSIS — I350 Nonrheumatic aortic (valve) stenosis: Secondary | ICD-10-CM

## 2016-06-22 NOTE — Progress Notes (Signed)
Cardiology Office Note NEW Patient VISIT  Date:  06/22/2016   ID:  Alexis Brady, DOB 1940-08-28, MRN [id number]  PCP:  Marton Redwood, MD  Cardiologist:  New Dr. Meda Coffee    Chief Complaint  Patient presents with  . Aortic Stenosis      History of Present Illness: Alexis Brady is a 76 y.o. female who presents for Aortic stenosis referred by her PCP Dr. Brigitte Pulse. She has hx of heart murmur.  Active pt with plan to skydive on her 75th Bday.  On PCP visit, she had 3/6 SEM with radiation to carotids, previous echo 2014 with mild AS.  ? bicuaspid Aortic valve.   .   UC, proctosigmoiditis. She was started on Lialda and budesonide in December. Hx of essential tremor. Wt lox thought to be due to Topamax.  Labs were stable, LDL 125,    ECHO 06/16/16  Study Conclusions  - Left ventricle: The cavity size was normal. Wall thickness was   normal. Systolic function was normal. The estimated ejection   fraction was in the range of 55% to 60%. Wall motion was normal;   there were no regional wall motion abnormalities. Doppler   parameters are consistent with abnormal left ventricular   relaxation (grade 1 diastolic dysfunction). Doppler parameters   are consistent with high ventricular filling pressure. - Aortic valve: Valve mobility was restricted. There was moderate   stenosis. Valve area (VTI): 1.03 cm^2. Valve area (Vmax): 0.99   cm^2. Valve area (Vmean): 0.99 cm^2. - Ascending aorta: The ascending aorta was mildly dilated. - Mitral valve: Calcified annulus.  Impressions:  - Normal LV systolic function; grade 1 diastolic dysfunction with   elevated LV filling pressure; calcified aortic valve with   moderate AS (mean gradient 20 mmHg); mildly dilated ascending   aorta.  Today she denies chest pain,  SOB or swelling, no syncope.  We discussed signs of aortic stenosis.  We also discussed her desire to skydive.   Past Medical History:  Diagnosis Date  . Anemia   . Asthma    only when had bronchitis per pt  . Depression   . Eczema   . Heart murmur   . Obesity   . Osteopenia   . Osteoporosis   . Restrictive lung disease   . Tremor   . Ulcerative colitis [facility])     Past Surgical History:  Procedure Laterality Date  . VOCAL CORD INJECTION       Current Outpatient Prescriptions  Medication Sig Dispense Refill  . albuterol (PROVENTIL HFA;VENTOLIN HFA) 108 (90 BASE) MCG/ACT inhaler Inhale into the lungs every 6 (six) hours as needed for wheezing or shortness of breath.    . AMBULATORY NON FORMULARY MEDICATION Medication Name: Budesonide 10 mg tablet to take one tablet by mouth daily 60 tablet 2  . aspirin EC 81 MG tablet Take 81 mg by mouth daily.    . Budesonide 9 MG TB24 Take 9 mg by mouth daily. 30 tablet 3  . calcium citrate-vitamin D (CITRACAL+D) 315-200 MG-UNIT per tablet Take 1 tablet by mouth daily.     . cholecalciferol (VITAMIN D) 400 UNITS TABS tablet Take 400 Units by mouth daily.    Marland Kitchen co-enzyme Q-10 30 MG capsule Take 100 mg by mouth daily.    Marland Kitchen ibuprofen (ADVIL,MOTRIN) 200 MG tablet Take 400 mg by mouth 2 (two) times daily.     . Melatonin 3 MG TABS Take by mouth at bedtime.    . mesalamine (LIALDA) 1.2  g EC tablet Take 2 tablets (2.4 g total) by mouth daily with breakfast. 60 tablet 3  . Multiple Vitamin (MULTIVITAMIN) tablet Take 1 tablet by mouth daily.    Marland Kitchen topiramate (TOPAMAX) 100 MG tablet Take 1 tablet (100 mg total) by mouth daily. 30 tablet 5  . vitamin B-12 (CYANOCOBALAMIN) 1000 MCG tablet Take 1,000 mcg by mouth daily.     Current Facility-Administered Medications  Medication Dose Route Frequency Provider Last Rate Last Dose  . 0.9 %  sodium chloride infusion  500 mL Intravenous Continuous Ladene Artist, MD        Allergies:   Patient has no known allergies.    Social History:  The patient  reports that she quit smoking about 33 years ago. She has never used smokeless tobacco. She reports that she does not drink alcohol or  use drugs.   Family History:  The patient's family history includes Arrhythmia in her sister; Breast cancer in her cousin and maternal aunt; CVA in her mother; Diabetes in her sister; Fibromyalgia in her sister and sister; Heart attack in her father; Heart failure in her father; Hypertension in her sister and sister.    ROS:  General:no colds or fevers, + weight loss, pt has been trying to lose Skin:no rashes or ulcers HEENT:no blurred vision, no congestion CV:see HPI PUL:see HPI GI:no diarrhea constipation or melena, no indigestion GU:no hematuria, no dysuria MS:no joint pain, no claudication Neuro:no syncope, no lightheadedness Endo:no diabetes, no thyroid disease  Wt Readings from Last 3 Encounters:  06/22/16 159 lb (72.1 kg)  06/08/16 158 lb 3.2 oz (71.8 kg)  03/03/16 162 lb (73.5 kg)     PHYSICAL EXAM: VS:  BP (!) 150/82   Pulse 74   Ht 5' 1"  (1.549 m)   Wt 159 lb (72.1 kg)   BMI 30.04 kg/m  , BMI Body mass index is 30.04 kg/m. General:Pleasant affect, NAD Skin:Warm and dry, brisk capillary refill HEENT:normocephalic, sclera clear, mucus membranes moist Neck:supple, no JVD, + radiation of mumur to neck.   Heart:S1S2 RRR with 2/6 systolic murmur of AS, no  gallup, rub or click Lungs:clear without rales, rhonchi, or wheezes ATF:TDDU, non tender, + BS, do not palpate liver spleen or masses Ext:no lower ext edema, 2+ pedal pulses, 2+ radial pulses Neuro:alert and oriented X 3, MAE, follows commands, + facial symmetry    EKG:  EKG is ordered today. The ekg ordered today demonstrates SR with normal EKG.    Recent Labs:--per Dr. Brigitte Pulse  Tchol 179, LDL 125, HDL 40, TG 70 Hgb 12.7  Glucose 83, BUN 29, Cr 0.7 Na 142 K+ 4.4, LFTs normal.  TSH 3.42  HGB A1C 5.2.   Lipid Panel No results found for: CHOL, TRIG, HDL, CHOLHDL, VLDL, LDLCALC, LDLDIRECT     Other studies Reviewed: Additional studies/ records that were reviewed today include:  Echo 06/16/16.  Study  Conclusions  - Left ventricle: The cavity size was normal. Wall thickness was   normal. Systolic function was normal. The estimated ejection   fraction was in the range of 55% to 60%. Wall motion was normal;   there were no regional wall motion abnormalities. Doppler   parameters are consistent with abnormal left ventricular   relaxation (grade 1 diastolic dysfunction). Doppler parameters   are consistent with high ventricular filling pressure. - Aortic valve: Valve mobility was restricted. There was moderate   stenosis. Valve area (VTI): 1.03 cm^2. Valve area (Vmax): 0.99   cm^2. Valve area (  Vmean): 0.99 cm^2. - Ascending aorta: The ascending aorta was mildly dilated. - Mitral valve: Calcified annulus.  Impressions:  - Normal LV systolic function; grade 1 diastolic dysfunction with   elevated LV filling pressure; calcified aortic valve with   moderate AS (mean gradient 20 mmHg); mildly dilated ascending   aorta.  ASSESSMENT AND PLAN:  1.  Aortic stenosis.  Moderate, asymptomatic -discussed with Dr. Meda Coffee,  Plan to repeatt echo in 6 months and follow up with Dr. Meda Coffee, pt will call if SOB, chest pain or syncope. We do not recommend skydiving at this time.  2. HTN, elevated here but usually controlled.   3. Essential tremor on topamax.   4. Recent dx of colitis.     Current medicines are reviewed with the patient today.  The patient Has no concerns regarding medicines.  The following changes have been made:  See above Labs/ tests ordered today include:see above  Disposition:   FU:  see above  Signed, Cecilie Kicks, NP  06/22/2016 5:37 PM    [facility] [place], [place], [place], [place] Phone: [phone]; Fax: [phone]

## 2016-06-22 NOTE — Patient Instructions (Signed)
Medication Instructions:  Your physician recommends that you continue on your current medications as directed. Please refer to the Current Medication list given to you today.   Labwork: NONE ORDERED   Testing/Procedures: 1. Your physician has requested that you have an echocardiogram. Echocardiography is a painless test that uses sound waves to create images of your heart. It provides your doctor with information about the size and shape of your heart and how well your heart's chambers and valves are working. This procedure takes approximately one hour. There are no restrictions for this procedure. THIS WILL NEED TO BE DONE IN 6 MONTHS 1 WEEK BEFORE YOUR APPT WITH DR. Meda Coffee.     Follow-Up: 1. Your physician wants you to follow-up in: 6 MONTHS WITH DR. Johann Capers will receive a reminder letter in the mail two months in advance. If you don't receive a letter, please call our office to schedule the follow-up appointment.   Any Other Special Instructions Will Be Listed Below (If Applicable).     If you need a refill on your cardiac medications before your next appointment, please call your pharmacy.

## 2016-07-07 NOTE — Progress Notes (Signed)
Subjective:    Alexis Brady was seen in consultation in the movement disorder clinic at the request of Marton Redwood, MD.  The evaluation is for tremor.  Tremor started in the hands about 15 years ago.  It was in the bilateral hands, L greater than R.  She was seen in [place] for the tremor.  It got worse over the last 2 years.  She is now notices voice and head tremor.  Her signature and writing bothers her the most now.  she does admit that the tremor bothers her family more than it bothers her and her children question whether she has PD.   She has trouble flipping pancakes.  She was recently tried on primidone 50 mg; she took it for 2 months and it didn't seem to help so she d/c it.  She wasn't sure that she liked the way that it made her feel. There is a family hx of tremor in her older sister.    03/01/15 update:  The patient is following up today.  I have not seen her in almost 2 years.  She has a history of essential tremor.  Last visit, she opted to take no medication.  She called me a few days after I saw her last and asked me about trying some type of vitamin for essential tremor, which I did not recommend.  We talked about Topamax last visit and ultimately she decided not to try it.  She had tried low-dose primidone in the past and did not find it effective and thought it made her a little dizzy.  She is not a beta blocker candidate because of asthma.  She does tell me that her PCP gave her a medication but it made her sleepy; however, she has no idea what that medication was.  Upon naming some, she thinks that it was primidone again.  She is having more trouble with vocal tremor and had some head tremor as well.  She is spilling soup and coffee and can barely read her handwriting.  Her handwriting is actually worse in the morning than the evening.  She just started albuterol and it has not affected tremor.    08/02/15 update:  The patient is following up today regarding essential tremor.  I  last saw her on December 9, at which point we started her on Topamax for essential tremor.  On December 27, she went to the emergency room with right flank pain.  The CT was negative for a stone, but suspicious for a recently passed stone.  She has been good since then.  She has noted more tremor in the voice and isn't sure if that is progression of the disease but never really noted much vocal tremor before.  She doesn't think that it has helped hand tremor that much but has been very happy with weight loss.  The topamax has caused her to have a metallic taste in the mouth.     01/03/16 update:  The patient follows up today regarding essential tremor.  She is on topiramate, 100 mg daily.  She has had no further episodes of kidney stones.  She does understand the risk.  She has dropped 13 lbs since last visit but "I feel good."  Appetite is less than it was.  Noted more tremor in the voice lately.  Had tremor is same as last visit.   States that she just had birthday and went to the [facility] and did tubing and white water rafting and  plans to sky dive next year.  07/08/16 update:  Patient seen today in follow-up.  She remains on topiramate, 100 mg daily.  No further kidney stones.  Denies falls.  Tremor has been stable.  Denies lightheadedness or near syncope.  Thought that she had colon CA but was dx with colitis instead.  States that it is not bothering her.  Pt was disappointed that the cardiologist told her she couldn't go sky diving.    Current/Previously tried tremor medications: primidone (low dose, no help but she didn't like the way it made her feel - little dizzy)  Current medications that may exacerbate tremor:  Albuterol but almost never uses it  Outside reports reviewed: historical medical records and referral letter/letters.  No Known Allergies  Current Outpatient Prescriptions on File Prior to Visit  Medication Sig Dispense Refill  . albuterol (PROVENTIL HFA;VENTOLIN HFA) 108 (90 BASE)  MCG/ACT inhaler Inhale into the lungs every 6 (six) hours as needed for wheezing or shortness of breath.    Marland Kitchen aspirin EC 81 MG tablet Take 81 mg by mouth daily.    . Budesonide 9 MG TB24 Take 9 mg by mouth daily. 30 tablet 3  . calcium citrate-vitamin D (CITRACAL+D) 315-200 MG-UNIT per tablet Take 1 tablet by mouth daily.     . cholecalciferol (VITAMIN D) 400 UNITS TABS tablet Take 400 Units by mouth daily.    Marland Kitchen co-enzyme Q-10 30 MG capsule Take 100 mg by mouth daily.    Marland Kitchen ibuprofen (ADVIL,MOTRIN) 200 MG tablet Take 400 mg by mouth 2 (two) times daily.     . Melatonin 3 MG TABS Take by mouth at bedtime.    . mesalamine (LIALDA) 1.2 g EC tablet Take 2 tablets (2.4 g total) by mouth daily with breakfast. 60 tablet 3  . Multiple Vitamin (MULTIVITAMIN) tablet Take 1 tablet by mouth daily.    Marland Kitchen topiramate (TOPAMAX) 100 MG tablet Take 1 tablet (100 mg total) by mouth daily. 30 tablet 5  . vitamin B-12 (CYANOCOBALAMIN) 1000 MCG tablet Take 1,000 mcg by mouth daily.     Current Facility-Administered Medications on File Prior to Visit  Medication Dose Route Frequency Provider Last Rate Last Dose  . 0.9 %  sodium chloride infusion  500 mL Intravenous Continuous Ladene Artist, MD        Past Medical History:  Diagnosis Date  . Anemia   . Asthma    only when had bronchitis per pt  . Depression   . Eczema   . Heart murmur   . Obesity   . Osteopenia   . Osteoporosis   . Restrictive lung disease   . Tremor   . Ulcerative colitis [facility])     Past Surgical History:  Procedure Laterality Date  . VOCAL CORD INJECTION      Social History   Social History  . Marital status: Married    Spouse name: N/A  . Number of children: 2  . Years of education: N/A   Occupational History  . Not on file.   Social History Main Topics  . Smoking status: Former Smoker    Quit date: 05/16/1983  . Smokeless tobacco: Never Used  . Alcohol use No  . Drug use: No  . Sexual activity: Not on file   Other  Topics Concern  . Not on file   Social History Narrative  . No narrative on file    Family Status  Relation Status  . Mother Deceased at age  53   CVA  . Sister Alive   Diabetes, HTN, Fibromyalgia  . Son Alive   healthy  . Son Deceased at age 56   MVA  . Father Deceased at age 82   CHF, MI  . Maternal Aunt Deceased  . Cousin Alive  . Sister Alive    Review of Systems A complete 10 system ROS was obtained and was negative apart from what is mentioned.   Objective:   VITALS:   Vitals:   07/08/16 1123  BP: 120/74  Pulse: (!) 56  SpO2: 98%  Weight: 159 lb (72.1 kg)  Height: 5' 1"  (1.549 m)   Wt Readings from Last 3 Encounters:  07/08/16 159 lb (72.1 kg)  06/22/16 159 lb (72.1 kg)  06/08/16 158 lb 3.2 oz (71.8 kg)    Gen:  Appears stated age and in NAD. HEENT:  Normocephalic, atraumatic. The mucous membranes are moist. The superficial temporal arteries are without ropiness or tenderness. Cardiovascular: Regular rate and rhythm with 3/6 SEM (radiates to bilateral carotids, L more than R) Lungs: Clear to auscultation bilaterally. Neck: There are no carotid bruits noted bilaterally.  NEUROLOGICAL:  Orientation:  The patient is alert and oriented x 3.  Cranial nerves: There is good facial symmetry.  Extraocular muscles are intact and visual fields are full to confrontational testing. Speech is fluent and clear but she does have vocal tremor. Soft palate rises symmetrically and there is no tongue deviation. Hearing is intact to conversational tone. Tone: Tone is good throughout. Sensation: Sensation is intact to light touch throughout Coordination:  The patient has no dysdiadichokinesia or dysmetria. Motor: Strength is 5/5 in the bilateral upper and lower extremities.  Shoulder shrug is equal bilaterally.  There is no pronator drift.  There are no fasciculations noted. Gait and Station: The patient is able to ambulate without difficulty.   MOVEMENT EXAM: Tremor:   There is mild tremor in the UE, noted most significantly with action. Becomes mod to severe when carrying a full glass of liquid but only mild to mod trouble with pouring the glass of water from one glass to another.  Intermittent head and jaw tremor. There is no null point.       Assessment/Plan:   1.  Essential Tremor.  -This is evidenced by the symmetrical nature and longstanding hx of gradually getting worse.  I don't think that this represents dystonic tremor as there is no null point.  Unfortunately, therefore, botox will not help the neck.  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  Discussed this in detail today as we did at last visit  -she asked me about DBS surgery.  She had multiple questions and expresses interest and will think about it.  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    -Long discussion with the patient again today.  She did have a kidney stone a few weeks after starting on Topamax, but she wanted to continue on the medication, knowing the potential risks. It was refilled today. 2.  Weight loss  -will need to monitor.  If from topamax alone, it should taper off 3.  Follow up is anticipated in the next 4-5 months, sooner should new neurologic issues arise.   Much greater than 50% of this visit was spent in counseling  with the patient and the family.  Total face to face time:  25 min

## 2016-07-08 ENCOUNTER — Encounter: Payer: Self-pay | Admitting: Neurology

## 2016-07-08 ENCOUNTER — Ambulatory Visit (INDEPENDENT_AMBULATORY_CARE_PROVIDER_SITE_OTHER): Payer: Medicare Other | Admitting: Neurology

## 2016-07-08 VITALS — BP 120/74 | HR 56 | Ht 61.0 in | Wt 159.0 lb

## 2016-07-08 DIAGNOSIS — G250 Essential tremor: Secondary | ICD-10-CM

## 2016-07-23 IMAGING — CT CT RENAL STONE PROTOCOL
2 of 3 series · 16 of 35 positions shown, 18 images · non-contrast
Comparison: None.

CLINICAL DATA: 74-year-old female with acute right flank and
abdominal pain.

EXAM:
CT ABDOMEN AND PELVIS WITHOUT CONTRAST
TECHNIQUE: Multidetector CT imaging of the abdomen and pelvis was performed
following the standard protocol without IV contrast.

[Series 3: coronal · coronal · 0.89mm/px · 3 of 105 slices shown]
[im 35/105  soft-tissue]
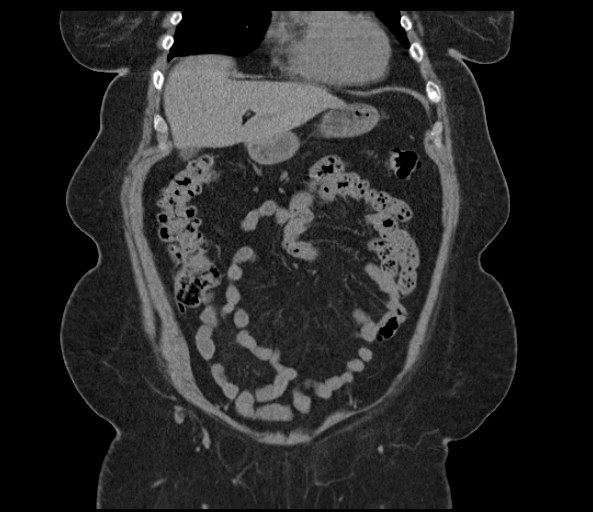
[im 47/105  soft-tissue]
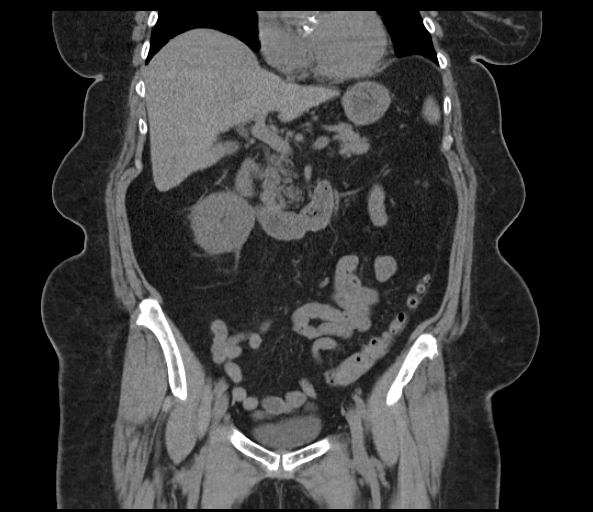
[im 58/105  soft-tissue]
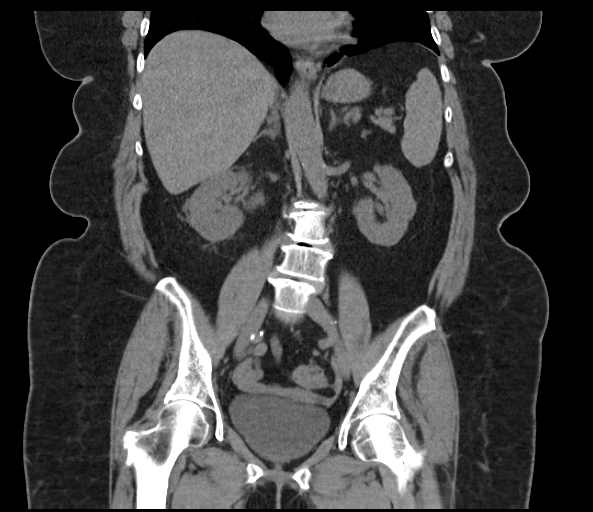

[Series 6: lung · axial · 0.86mm/px · z∈[+1440,+1530]mm · 13 of 21 slices shown, 15 images]
[im 2/21  soft-tissue]
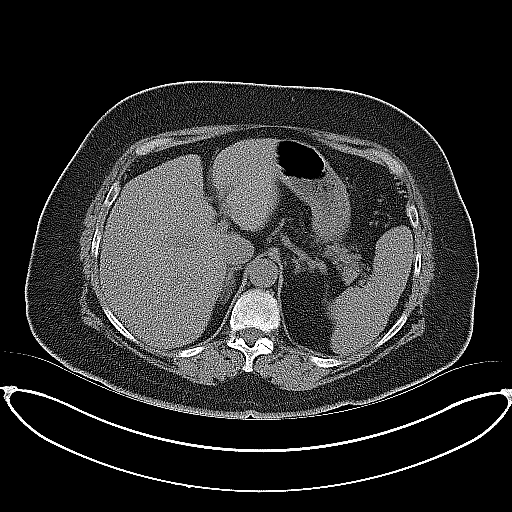
[im 2/21  bone]
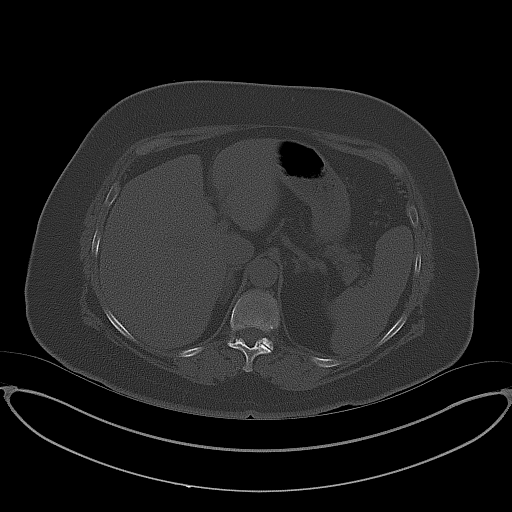
[im 4/21  soft-tissue]
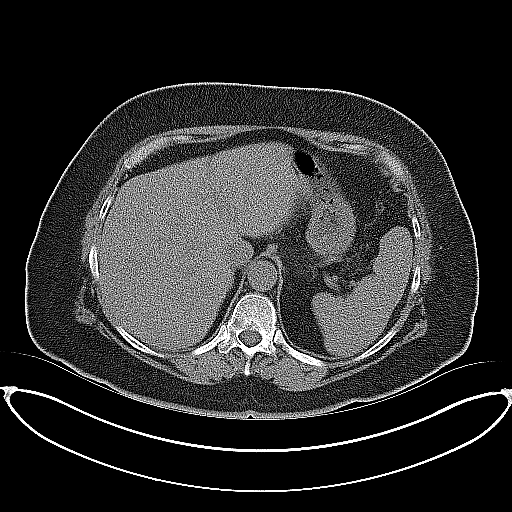
[im 5/21  soft-tissue]
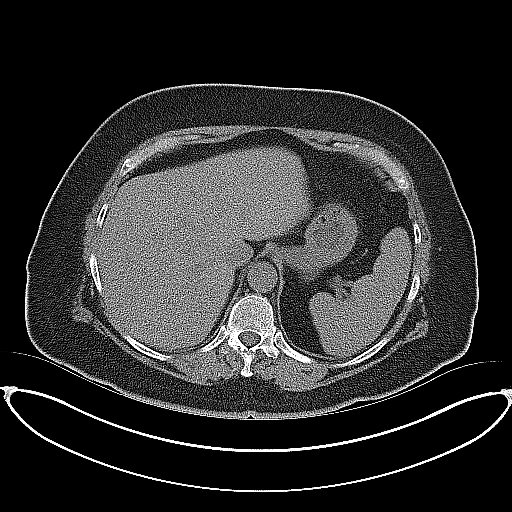
[im 7/21  soft-tissue]
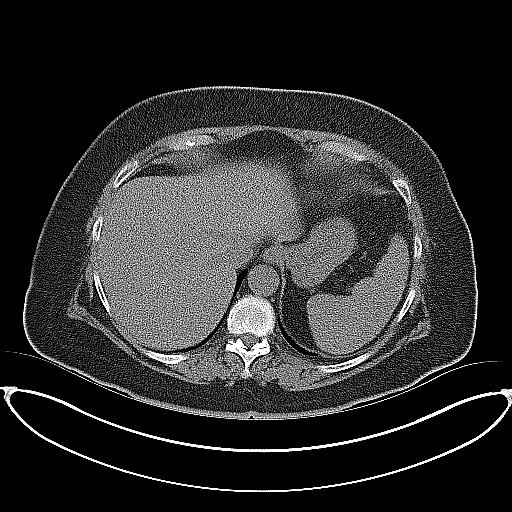
[im 8/21  soft-tissue]
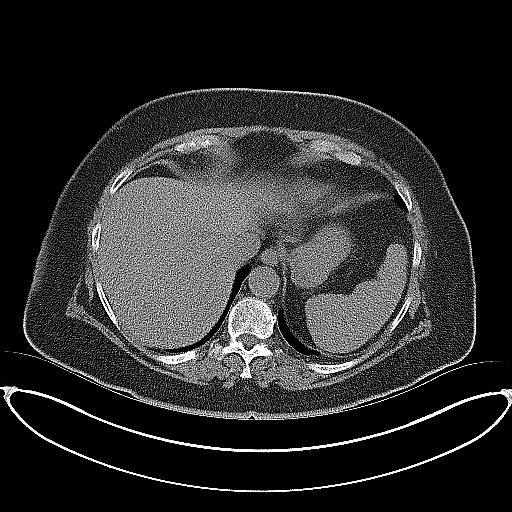
[im 10/21  soft-tissue]
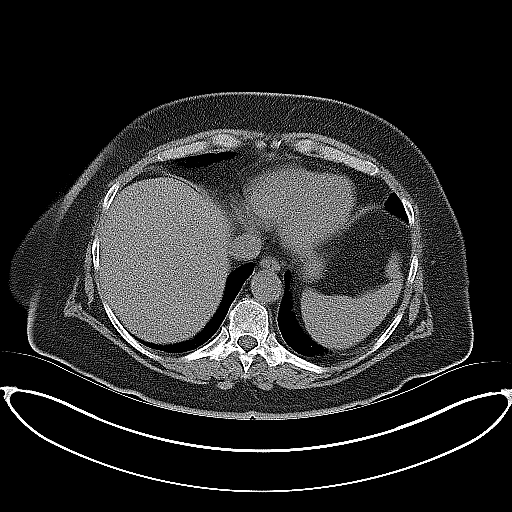
[im 11/21  soft-tissue]
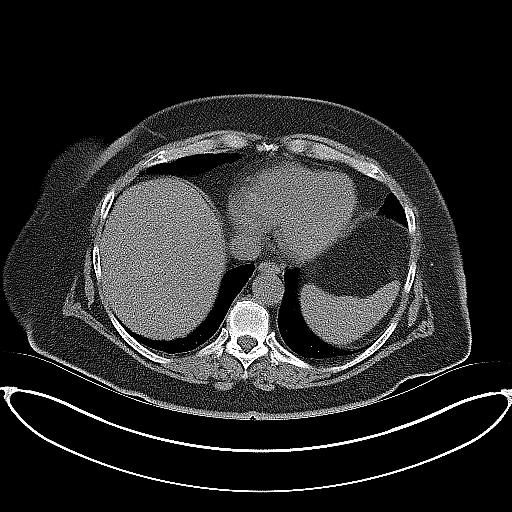
[im 12/21  soft-tissue]
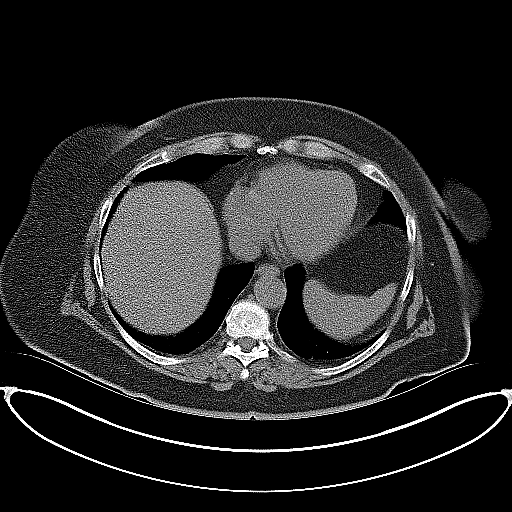
[im 14/21  soft-tissue]
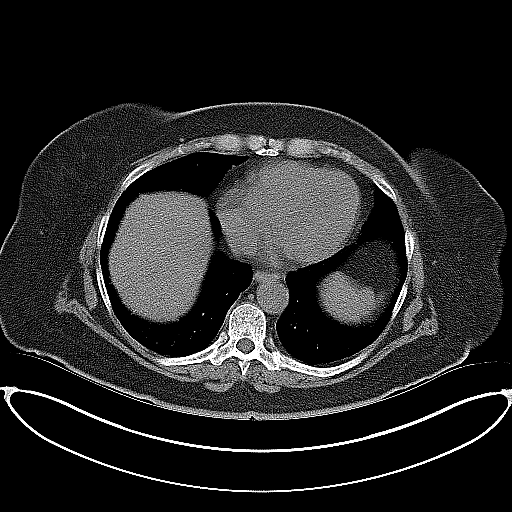
[im 14/21  bone]
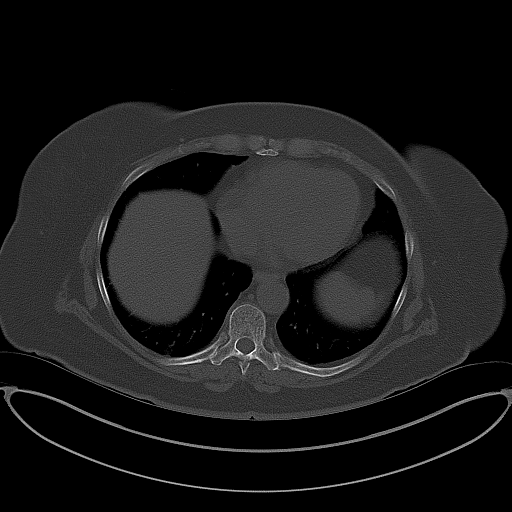
[im 15/21  soft-tissue]
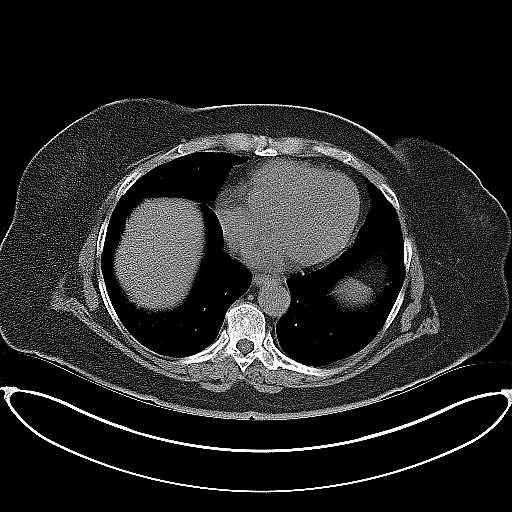
[im 17/21  soft-tissue]
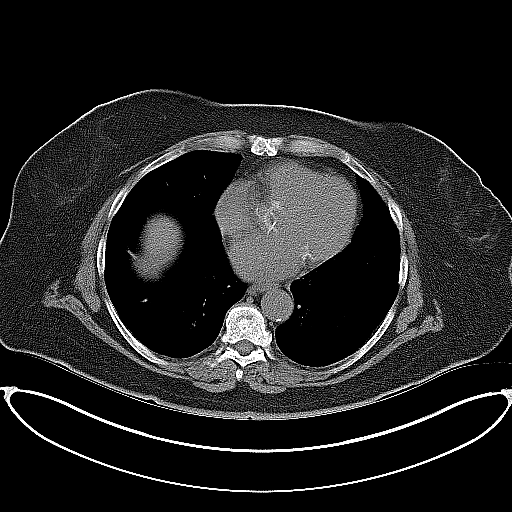
[im 18/21  soft-tissue]
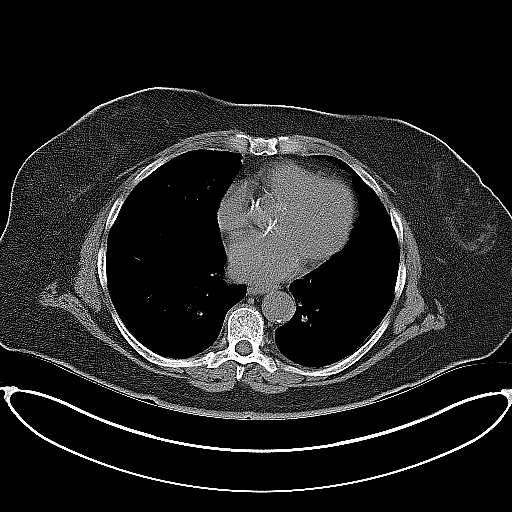
[im 20/21  soft-tissue]
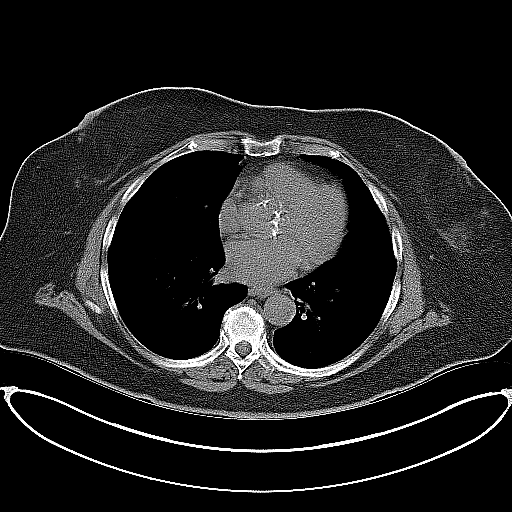

[16 of 35 positions shown; findings below may reference images not displayed]

FINDINGS: Please note that parenchymal abnormalities may be missed without
intravenous contrast.

Lower chest:  A 5 mm right lower lobe nodule is noted.

Hepatobiliary: The liver and gallbladder are unremarkable. There is
no evidence of biliary dilatation.

Pancreas: Unremarkable

Spleen: Unremarkable

Adrenals/Urinary Tract: Fullness of the right intrarenal collecting
system and ureter noted with right perinephric and periureteral
inflammation. No obstructing cause for urinary calculi are
identified.

The adrenal glands and bladder are unremarkable.

Stomach/Bowel: Colonic diverticulosis noted without evidence of
diverticulitis. There is no evidence of bowel obstruction or
definite bowel wall thickening. The appendix is normal.

Vascular/Lymphatic: No enlarged lymph nodes identified. Aortic
atherosclerotic calcifications noted without aneurysm.

Reproductive: Prominence of the uterus is noted -question fibroid.

Other: No free fluid, focal collection or pneumoperitoneum.

Musculoskeletal: No acute or suspicious abnormalities. Lumbar spine
scoliosis and degenerative changes are noted.
IMPRESSION: Right perinephric and periureteral inflammation and fullness of the
right intrarenal collecting system and right ureter. No obstructing
cause identified. This may be secondary to a passed stone, infection
or nonspecific inflammation. Correlate clinically.

5 mm right lower lobe nodule. If the patient is at high risk for
bronchogenic carcinoma, follow-up chest CT at 6-12 months is
recommended. If the patient is at low risk for bronchogenic
carcinoma, follow-up chest CT at 12 months is recommended. This
recommendation follows the consensus statement: Guidelines for
Management of Small Pulmonary Nodules Detected on CT Scans: A
Statement from the [HOSPITAL] as published in Radiology
2446;[DATE].

Colonic diverticulosis without diverticulitis.

## 2016-08-04 ENCOUNTER — Other Ambulatory Visit: Payer: Self-pay | Admitting: Neurology

## 2016-08-23 ENCOUNTER — Other Ambulatory Visit: Payer: Self-pay | Admitting: Gastroenterology

## 2016-10-16 ENCOUNTER — Encounter (HOSPITAL_COMMUNITY): Payer: Medicare Other

## 2016-10-20 ENCOUNTER — Other Ambulatory Visit (HOSPITAL_COMMUNITY): Payer: Self-pay | Admitting: *Deleted

## 2016-10-21 ENCOUNTER — Ambulatory Visit (HOSPITAL_COMMUNITY)
Admission: RE | Admit: 2016-10-21 | Discharge: 2016-10-21 | Disposition: A | Discharge status: Home / Self Care | Payer: Medicare Other | Source: Ambulatory Visit | Attending: Internal Medicine | Admitting: Internal Medicine

## 2016-10-21 DIAGNOSIS — M810 Age-related osteoporosis without current pathological fracture: Secondary | ICD-10-CM | POA: Insufficient documentation

## 2016-10-21 MED ORDER — ZOLEDRONIC ACID 5 MG/100ML IV SOLN
INTRAVENOUS | Status: AC
  Filled 2016-10-21: qty 100

## 2016-10-21 MED ORDER — ZOLEDRONIC ACID 5 MG/100ML IV SOLN
5.0000 mg | Freq: Once | INTRAVENOUS | Status: AC
  Administered 2016-10-21: 09:00:00 5 mg via INTRAVENOUS

## 2016-10-21 NOTE — Discharge Instructions (Signed)
Zoledronic Acid injection (Paget's Disease, Osteoporosis) What is this medicine? ZOLEDRONIC ACID (ZOE le dron ik AS id) lowers the amount of calcium loss from bone. It is used to treat Paget's disease and osteoporosis in women. This medicine may be used for other purposes; ask your health care provider or pharmacist if you have questions. COMMON BRAND NAME(S): Reclast, Zometa What should I tell my health care provider before I take this medicine? They need to know if you have any of these conditions: -aspirin-sensitive asthma -cancer, especially if you are receiving medicines used to treat cancer -dental disease or wear dentures -infection -kidney disease -low levels of calcium in the blood -past surgery on the parathyroid gland or intestines -receiving corticosteroids like dexamethasone or prednisone -an unusual or allergic reaction to zoledronic acid, other medicines, foods, dyes, or preservatives -pregnant or trying to get pregnant -breast-feeding How should I use this medicine? This medicine is for infusion into a vein. It is given by a health care professional in a hospital or clinic setting. Talk to your pediatrician regarding the use of this medicine in children. This medicine is not approved for use in children. Overdosage: If you think you have taken too much of this medicine contact a poison control center or emergency room at once. NOTE: This medicine is only for you. Do not share this medicine with others. What if I miss a dose? It is important not to miss your dose. Call your doctor or health care professional if you are unable to keep an appointment. What may interact with this medicine? -certain antibiotics given by injection -NSAIDs, medicines for pain and inflammation, like ibuprofen or naproxen -some diuretics like bumetanide, furosemide -teriparatide This list may not describe all possible interactions. Give your health care provider a list of all the medicines,  herbs, non-prescription drugs, or dietary supplements you use. Also tell them if you smoke, drink alcohol, or use illegal drugs. Some items may interact with your medicine. What should I watch for while using this medicine? Visit your doctor or health care professional for regular checkups. It may be some time before you see the benefit from this medicine. Do not stop taking your medicine unless your doctor tells you to. Your doctor may order blood tests or other tests to see how you are doing. Women should inform their doctor if they wish to become pregnant or think they might be pregnant. There is a potential for serious side effects to an unborn child. Talk to your health care professional or pharmacist for more information. You should make sure that you get enough calcium and vitamin D while you are taking this medicine. Discuss the foods you eat and the vitamins you take with your health care professional. Some people who take this medicine have severe bone, joint, and/or muscle pain. This medicine may also increase your risk for jaw problems or a broken thigh bone. Tell your doctor right away if you have severe pain in your jaw, bones, joints, or muscles. Tell your doctor if you have any pain that does not go away or that gets worse. Tell your dentist and dental surgeon that you are taking this medicine. You should not have major dental surgery while on this medicine. See your dentist to have a dental exam and fix any dental problems before starting this medicine. Take good care of your teeth while on this medicine. Make sure you see your dentist for regular follow-up appointments. What side effects may I notice from receiving this medicine?  Side effects that you should report to your doctor or health care professional as soon as possible: -allergic reactions like skin rash, itching or hives, swelling of the face, lips, or tongue -anxiety, confusion, or depression -breathing problems -changes in  vision -eye pain -feeling faint or lightheaded, falls -jaw pain, especially after dental work -mouth sores -muscle cramps, stiffness, or weakness -redness, blistering, peeling or loosening of the skin, including inside the mouth -trouble passing urine or change in the amount of urine Side effects that usually do not require medical attention (report to your doctor or health care professional if they continue or are bothersome): -bone, joint, or muscle pain -constipation -diarrhea -fever -hair loss -irritation at site where injected -loss of appetite -nausea, vomiting -stomach upset -trouble sleeping -trouble swallowing -weak or tired This list may not describe all possible side effects. Call your doctor for medical advice about side effects. You may report side effects to FDA at 1-800-FDA-1088. Where should I keep my medicine? This drug is given in a hospital or clinic and will not be stored at home. NOTE: This sheet is a summary. It may not cover all possible information. If you have questions about this medicine, talk to your doctor, pharmacist, or health care provider.  2018 Elsevier/Gold Standard (2013-08-05 14:19:57)

## 2016-10-26 ENCOUNTER — Other Ambulatory Visit: Payer: Self-pay

## 2016-10-26 ENCOUNTER — Ambulatory Visit (HOSPITAL_COMMUNITY): Payer: Medicare Other | Attending: Internal Medicine

## 2016-10-26 DIAGNOSIS — I350 Nonrheumatic aortic (valve) stenosis: Secondary | ICD-10-CM | POA: Insufficient documentation

## 2016-11-02 ENCOUNTER — Telehealth: Payer: Self-pay | Admitting: Gastroenterology

## 2016-11-02 NOTE — Telephone Encounter (Signed)
Patient is taking Budesonide and was recently diagnosed with osteoporosis.  She is asking if ok to continue.  She is not having diarrhea.  She is having formed stools. She was to stop the budesonide in march.  She will discontinue it now.  She has another bottle and wants to know what to do with the pills.  She is advised to put them aside and call back if she has concerns prior to restarting.

## 2016-12-09 ENCOUNTER — Other Ambulatory Visit (HOSPITAL_COMMUNITY): Payer: Medicare Other

## 2016-12-21 ENCOUNTER — Telehealth: Payer: Self-pay | Admitting: Neurology

## 2016-12-21 DIAGNOSIS — R498 Other voice and resonance disorders: Secondary | ICD-10-CM

## 2016-12-21 NOTE — Telephone Encounter (Signed)
Tried to call patient. Home number belongs to son. Patient's number is [phone].  Patient made aware will send referral to neuro rehab for speech therapy. Can be reached at [phone]. Patient given number.

## 2016-12-21 NOTE — Telephone Encounter (Signed)
Patient would like a referral to [facility] for Speech therapy. Please call and advise.

## 2016-12-21 NOTE — Telephone Encounter (Signed)
Not ever mentioned in note. Is this something you would order for her vocal tremor?

## 2016-12-21 NOTE — Telephone Encounter (Signed)
Sure.  Insurance underwriter often won't pay for ST without PT/OT but you can try

## 2016-12-25 ENCOUNTER — Ambulatory Visit: Payer: Medicare Other | Attending: Neurology | Admitting: *Deleted

## 2016-12-25 DIAGNOSIS — R498 Other voice and resonance disorders: Secondary | ICD-10-CM

## 2016-12-25 NOTE — Therapy (Signed)
[facility] [address] [place], [place], [address] Phone: [phone]   Fax:  [phone]  Speech Language Pathology Evaluation  Patient Details  Name: Alexis Brady MRN: [id number] Date of Birth: Jan 18, 1941 Referring Provider: Dr. Eustace Quail. Tat  Encounter Date: 12/25/2016      End of Session - 12/25/16 1209    Visit Number 1   Number of Visits 8   Date for SLP Re-Evaluation 02/19/17   SLP Start Time 1100   SLP Stop Time  1150   SLP Time Calculation (min) 50 min   Activity Tolerance Patient tolerated treatment well      Past Medical History:  Diagnosis Date  . Anemia   . Asthma    only when had bronchitis per pt  . Depression   . Eczema   . Heart murmur   . Obesity   . Osteopenia   . Osteoporosis   . Restrictive lung disease   . Tremor   . Ulcerative colitis [facility])     Past Surgical History:  Procedure Laterality Date  . VOCAL CORD INJECTION      There were no vitals filed for this visit.      Subjective Assessment - 12/25/16 1200    Subjective I don't care about getting louder - they can just listen. I want to be able to speak better. It is an effort to talk   Currently in Pain? No/denies            SLP Evaluation [facility] - 12/25/16 1200      SLP Visit Information   SLP Received On 12/25/16   Referring Provider Dr. Eustace Quail. Tat   Onset Date 3-4 years ago   Medical Diagnosis Essential tremor, vocal tremor     Subjective   Subjective Pt seen in outpatient clinic for evaluation. No family present   Patient/Family Stated Goal to improve voice quality and decrease effort required.     General Information   HPI 76 year old female referred for outpatient voice evaluation for vocal tremor. Pt diagnosed with vocal tremor 3-4 years ago. Essential tremor began with tremor in the hands (L>R) 15 years ago. Pt also reports history of vocal fold surgery (calluses), and exhibits difficulty  with fine motor activity (writing, typing)   Behavioral/Cognition --  [facility]   Mobility Status Pt ambulates independently     Prior Functional Status   Cognitive/Linguistic Baseline Within functional limits    Lives With Spouse     Cognition   Overall Cognitive Status Within Functional Limits for tasks assessed     Auditory Comprehension   Overall Auditory Comprehension Appears within functional limits for tasks assessed     Expression   Primary Mode of Expression Verbal     Verbal Expression   Overall Verbal Expression Appears within functional limits for tasks assessed     Written Expression   Dominant Hand Right     Oral Motor/Sensory Function   Overall Oral Motor/Sensory Function Appears within functional limits for tasks assessed     Motor Speech   Overall Motor Speech Appears within functional limits for tasks assessed   Respiration Impaired   Level of Impairment Conversation  clavicular breathing, tension in neck and shoulders   Phonation Low vocal intensity tremors noted   Resonance Within functional limits   Articulation Within functional limitis  pt overarticulates to increase intelligibility   Intelligibility Intelligible   Motor Planning Witnin functional limits   Motor Speech Errors  Not applicable   Effective Techniques Slow rate;Over-articulate   Phonation Impaired   Tension Present Neck;Shoulder   Volume Soft;Decibel Level  55dB in conversation, loudness up to 90dB with cue to produce a loud /a/   Pitch Appropriate           SLP Education - 12/25/16 1209    Education provided Yes   Education Details possible goals, course of treatment, recommendations for ENT and OT evaluations   Person(s) Educated Patient   Methods Explanation;Demonstration   Comprehension Verbalized understanding          SLP Short Term Goals - 12/25/16 1259      SLP SHORT TERM GOAL #1   Title Pt will use abdominal breathing at rest 85% of the time over 5 minutes, x2    Time 4   Period Weeks   Status New   Target Date 01/22/17     SLP SHORT TERM GOAL #2   Title Pt will demonstrate abdominal breathing in 15/20 sentence length responses x2 sessions   Time 4   Period Weeks   Status New   Target Date 01/22/17     SLP SHORT TERM GOAL #3   Title Pt will exhibit "confidential voice" in 15/20 sentence length responses x2 sessions   Time 4   Period Weeks   Status New   Target Date 01/22/17          SLP Long Term Goals - 12/25/16 1257      SLP LONG TERM GOAL #1   Title Pt will demonstrate adequat respiratory support 80% of the time in 10 minute conversation   Time 8   Period Weeks   Status New   Target Date 02/19/17     SLP LONG TERM GOAL #2   Title Pt will demonstrate improvement in s:z ratio   Baseline 2.1   Time 8   Period Weeks   Status New   Target Date 02/19/17     SLP LONG TERM GOAL #3   Title Pt will demonstrate improvement in VHI and VRQOL   Baseline 38, 22   Time 8   Period Weeks   Status New   Target Date 02/19/17     SLP LONG TERM GOAL #4   Title Pt will demonstrate vocal function exercises with good technique x2   Time 8   Period Weeks   Status New   Target Date 02/19/17     SLP LONG TERM GOAL #5   Title Pt will exhibit voicing without tremor in 15/20 sentence length responses   Time 8   Period Weeks   Status New   Target Date 02/19/17          Plan - 12/25/16 1248    Clinical Impression Statement SUBJECTIVE/PATIENT REPORT: Pt reports she is active with family activities and daily exercise. She reports no pain, seasonal allergies, or reflux, but indicates talking requires a significant amount of effort. She independently overarticulates and exhibits slow speech rate, which effectively results in full intelligibility. She reports no significant change as the day progresses. Pt did not clear her throat or cough during this evaluation. Of note, pt reports removal of "vocal fold calluses" 20-25 years ago.   VOCAL  HYGIENE: Pt consumes approximately 32oz of water a day, no caffeine, alcohol, or smoking. She does admit to raising her voice to her husband every few days, due to argument and/or his poor hearing. Minimal exposure to smoke, chemicals, allergens or temperature changes. Pt reports  she does not sing.   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 MPT /a/ 21 sec. /i/ 29 sec. Tension and tightness reported in neck and shoulder areas. Pt voice quality is clear (not wet), consistently tremulous during conversation. Sustained vowel production revealed clear tremor-free phonation for the first 15 seconds. Pt was able to alter loudness, with conversational average 55dB. Loud /a up to 90dB. Pt exhibits a wide pitch range.   BREATH SUPPORT: clavicular breathing noted at rest, during assessment tasks, and during conversation.  VOICE HANDICAP INDEX:        38 VOICE RELATED QUALITY OF LIFE (VRQOL):  (>30=poor, 10-15=excellent)  22 GLOTTAL FUNCTION INDEX:    (n=<4)     10 REFLUX SYMPTOM INDEX:    (n=<10-13)    5  RECOMMENDATIONS:  1. Referral to ENT physician for re-evaluation of vocal fold function, given history of surgery for calluses.  2. Referral to Occupational Therapy (OT) for assessment of upper extremity tremor and decreased function.  3. Pt would also benefit from skilled ST focusing on diaphragmatic breathing exercises, maximization of the most effective/functional pitch, increased awareness of tension in neck and shoulders, improvement of s:z ratio, and improved scale measurements (VHI, VRQOL) to maximize vocal function and improve patient's quality of life.    Speech Therapy Frequency 1x /week   Duration --  8 weeks   Treatment/Interventions SLP instruction and feedback;Functional tasks;Compensatory strategies;Internal/external aids;Patient/family education   Potential to Achieve Goals Good   Potential  Considerations Ability to learn/carryover information;Family/community support;Cooperation/participation level   SLP Home Exercise Plan discussed   Consulted and Agree with Plan of Care Patient      Patient will benefit from skilled therapeutic intervention in order to improve the following deficits and impairments:   Other voice and resonance disorders      G-Codes - 19-Jan-2017 1304    Functional Assessment Tool Used asha noms, clinical judgment   Functional Limitations Voice   Voice Current Status (G9171) At least 60 percent but less than 80 percent impaired, limited or restricted   Voice Goal Status (G9172) At least 20 percent but less than 40 percent impaired, limited or restricted      Problem List Patient Active Problem List   Diagnosis Date Noted  . 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 Special screening for malignant neoplasms, colon 02/19/2016  . Change in bowel habits 02/19/2016  . Blood in stool 02/19/2016  . Loss of weight 02/19/2016  . Essential tremor 05/23/2013   Avira Tillison [facility], CCC-SLP Speech Language Pathologist  Shonna Chock January 19, 2017, 1:05 PM  [facility] [address] [place], [place], [address] Phone: [phone]   Fax:  [phone]  Name: Casilda Pickerill MRN: [id number] Date of Birth: 1940-09-26

## 2016-12-29 ENCOUNTER — Ambulatory Visit: Payer: Medicare Other | Admitting: *Deleted

## 2016-12-29 DIAGNOSIS — R498 Other voice and resonance disorders: Secondary | ICD-10-CM | POA: Diagnosis not present

## 2016-12-29 NOTE — Therapy (Signed)
[facility] [address] [place], [place], [address] Phone: [phone]   Fax:  [phone]  Speech Language Pathology Treatment  Patient Details  Name: Alexis Brady MRN: [id number] Date of Birth: 1940-10-16 Referring Provider: Dr. Eustace Quail. Tat  Encounter Date: 12/29/2016      End of Session - 12/29/16 1249    Visit Number 2   Number of Visits 8   Date for SLP Re-Evaluation 02/19/17   SLP Start Time 7989   SLP Stop Time  2119   SLP Time Calculation (min) 50 min   Activity Tolerance Patient tolerated treatment well      Past Medical History:  Diagnosis Date  . Anemia   . Asthma    only when had bronchitis per pt  . Depression   . Eczema   . Heart murmur   . Obesity   . Osteopenia   . Osteoporosis   . Restrictive lung disease   . Tremor   . Ulcerative colitis [facility])     Past Surgical History:  Procedure Laterality Date  . VOCAL CORD INJECTION      There were no vitals filed for this visit.      Subjective Assessment - 12/29/16 1159    Subjective I have to show these (voice conservation techniques) to my husband Alexis Brady. He always asks me to repeat myself, and talks to me from another room. We have an appointment for him to have his hearing checked.   Currently in Pain? No/denies               ADULT SLP TREATMENT - 12/29/16 0001      General Information   Behavior/Cognition Alert;Cooperative;Pleasant mood     Treatment Provided   Treatment provided Cognitive-Linquistic  Voice     Pain Assessment   Pain Assessment No/denies pain     Cognitive-Linquistic Treatment   Treatment focused on Voice;Patient/family/caregiver education   Skilled Treatment SLP called Dr. Doristine Devoid office and left a voicemail with Alexis Brady (Psychologist, sport and exercise) regarding recommendations for OT and ENT referrals.   ST facilitated skilled session by reviewing and practicing technique for abdominal breathing, neck  stretches, yawn sigh, voice conservation program, and straw phonation. Pt was encouraged to complete exercises at least daily. Instruction was given regarding yawn sigh, and pt was able to demonstrate this with clear tremor-free voicing for 3 seconds. Pt was quickly able to identify when her voice sounded "tight/tense" or "open/relaxed with min cues".      Assessment / Recommendations / Plan   Plan Continue with current plan of care     Progression Toward Goals   Progression toward goals Progressing toward goals          SLP Education - 12/29/16 1249    Education provided Yes   Education Details handouts provided   [employer]) Educated Patient   Methods Explanation;Demonstration;Verbal cues;Handout   Comprehension Verbalized understanding;Returned demonstration;Verbal cues required;Need further instruction          SLP Short Term Goals - 12/29/16 1251      SLP SHORT TERM GOAL #1   Title Pt will use abdominal breathing at rest 85% of the time over 5 minutes, x2   Time 4   Period Weeks   Status On-going     SLP SHORT TERM GOAL #2   Title Pt will demonstrate abdominal breathing in 15/20 sentence length responses x2 sessions   Time 4   Period Weeks   Status On-going  SLP SHORT TERM GOAL #3   Title Pt will exhibit "confidential voice" in 15/20 sentence length responses x2 sessions   Time 4   Period Weeks   Status On-going          SLP Long Term Goals - 12/29/16 1252      SLP LONG TERM GOAL #1   Title Pt will demonstrate adequate respiratory support 80% of the time in 10 minute conversation   Time 8   Period Weeks   Status On-going     SLP LONG TERM GOAL #2   Title Pt will demonstrate improvement in s:z ratio   Baseline 2.1   Time 8   Period Weeks   Status On-going     SLP LONG TERM GOAL #3   Title Pt will demonstrate improvement in VHI and VRQOL   Baseline 38, 22   Time 8   Period Weeks   Status On-going     SLP LONG TERM GOAL #4   Title Pt will  demonstrate vocal function exercises with good technique x2   Time 8   Period Weeks   Status On-going     SLP LONG TERM GOAL #5   Title Pt will exhibit voicing without tremor in 15/20 sentence length responses   Time 8   Period Weeks   Status On-going          Plan - 12/29/16 1249    Clinical Impression Statement Pt is highly motivated to improve her vocal quality, but expresses worry regarding what the ENT may find on evaluation (history or vocal fold calluses, family member with recently diagnosed throat cancer). Pt receptive to all techniques, and reports willingness to complete exercises and implement strategies at home. Continued ST intervention is recommended to maximize vocal quality and improve quality of life.    Speech Therapy Frequency 1x /week   Duration --  8 weeks   Treatment/Interventions SLP instruction and feedback;Functional tasks;Compensatory strategies;Internal/external aids;Patient/family education   Potential to Achieve Goals Good   Potential Considerations Ability to learn/carryover information;Family/community support;Cooperation/participation level   SLP Home Exercise Plan written information reviewed and provided   Consulted and Agree with Plan of Care Patient      Patient will benefit from skilled therapeutic intervention in order to improve the following deficits and impairments:   Other voice and resonance disorders    Problem List Patient Active Problem List   Diagnosis Date Noted  . 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 Special screening for malignant neoplasms, colon 02/19/2016  . Change in bowel habits 02/19/2016  . Blood in stool 02/19/2016  . Loss of weight 02/19/2016  . Essential tremor 05/23/2013   Alexis Brady [facility], CCC-SLP Speech Language Pathologist  Alexis Brady 12/29/2016, 12:53 PM  [facility] [address] [place],  [place], [address] Phone: [phone]   Fax:  [phone]   Name: Alexis Brady MRN: [id number] Date of Birth: March 29, 1940

## 2016-12-30 ENCOUNTER — Telehealth: Payer: Self-pay | Admitting: Neurology

## 2016-12-30 DIAGNOSIS — R498 Other voice and resonance disorders: Secondary | ICD-10-CM

## 2016-12-30 NOTE — Telephone Encounter (Signed)
Left message on machine for patient to call back.

## 2016-12-30 NOTE — Telephone Encounter (Signed)
Received vm from speech therapist stating she sent a message asking for OT for patient and ENT referral.  Dr. Carles Collet did you receive this message?

## 2016-12-30 NOTE — Telephone Encounter (Signed)
I only got a message asking for ST.  I had talked to the patient about botox with ENT for vocal tremor and she denied referral.  This would be the only treatment for this.  What else would referral be for?

## 2016-12-31 ENCOUNTER — Other Ambulatory Visit: Payer: Self-pay | Admitting: Gastroenterology

## 2016-12-31 NOTE — Telephone Encounter (Signed)
[facility] ENT [phone] 01/06/17 at 2:00 pm with Dr. Blenda Nicely  Patient made aware of date/time.

## 2016-12-31 NOTE — Telephone Encounter (Signed)
Spoke with patient. She wasn't told she needs OT, so unsure about that.   She states she was told by the speech therapist that ENT would be a good idea to look at the vocal chords to make sure they don't find any abnormalities.   Please advise.

## 2016-12-31 NOTE — Telephone Encounter (Signed)
I have no objection but don't think that there will be much there.  She just has vocal tremor.  Happy to refer to [facility] ENT

## 2016-12-31 NOTE — Telephone Encounter (Signed)
Patient made aware. Will make referral.

## 2017-01-05 ENCOUNTER — Ambulatory Visit: Payer: Medicare Other | Admitting: *Deleted

## 2017-01-05 DIAGNOSIS — R498 Other voice and resonance disorders: Secondary | ICD-10-CM | POA: Diagnosis not present

## 2017-01-05 NOTE — Therapy (Signed)
[facility] [address] [place], [place], [address] Phone: [phone]   Fax:  [phone]  Speech Language Pathology Treatment  Patient Details  Name: Alexis Brady MRN: [id number] Date of Birth: 1940/07/16 Referring Provider: Dr. Eustace Quail. Tat  Encounter Date: 01/05/2017      End of Session - 01/05/17 1141    Visit Number 3   Number of Visits 8   Date for SLP Re-Evaluation 02/19/17   SLP Start Time 92   SLP Stop Time  1130   SLP Time Calculation (min) 30 min   Activity Tolerance Patient tolerated treatment well      Past Medical History:  Diagnosis Date  . Anemia   . Asthma    only when had bronchitis per pt  . Depression   . Eczema   . Heart murmur   . Obesity   . Osteopenia   . Osteoporosis   . Restrictive lung disease   . Tremor   . Ulcerative colitis [facility])     Past Surgical History:  Procedure Laterality Date  . VOCAL CORD INJECTION      There were no vitals filed for this visit.      Subjective Assessment - 01/05/17 1108    Subjective I had a house full because of the power outages   Currently in Pain? No/denies               ADULT SLP TREATMENT - 01/05/17 0001      General Information   Behavior/Cognition Alert;Cooperative;Pleasant mood     Treatment Provided   Treatment provided Cognitive-Linquistic     Pain Assessment   Pain Assessment No/denies pain     Cognitive-Linquistic Treatment   Treatment focused on Voice;Patient/family/caregiver education   Skilled Treatment Patient reports she has an appointment Thursday with ENT Dr. Janace Hoard, given current vocal tremor, history of vocal fold calluses and family history of throat cancer.  SLP and pt reviewed and practiced strategies for decreasing laryngeal tension, abdominal breathing techniques, yawn/sigh, and over-articulation, which pt completes accurately. Pt reports she is following suggestions and exercising at  home, and unless issues are identified at ENT visit, she is satisfied with continuing exercises/techniques independently at home. Pt will contact SLP following ENT visit for MD assessment of vocal fold function, and will continue or DC skilled ST services based on that information.     Assessment / Recommendations / Plan   Plan Continue with current plan of care     Progression Toward Goals   Progression toward goals Progressing toward goals          SLP Education - 01/05/17 1140    Education provided Yes   Education Details continue home practice of relaxation and diaphragmatic breathing techniques, and overarticulation for intelligibility.   Person(s) Educated Patient   Methods Explanation;Demonstration;Verbal cues   Comprehension Verbalized understanding;Returned demonstration          SLP Short Term Goals - 01/05/17 1145      SLP SHORT TERM GOAL #1   Title Pt will use abdominal breathing at rest 85% of the time over 5 minutes,   Status Achieved     SLP SHORT TERM GOAL #2   Title Pt will demonstrate abdominal breathing in 15/20 sentence length responses   Time 3   Period Weeks   Status Revised     SLP SHORT TERM GOAL #3   Title Pt will exhibit "confidential voice" in 15/20 sentence length responses   Time  3   Period Weeks   Status Revised          SLP Long Term Goals - 01/05/17 1147      SLP LONG TERM GOAL #1   Title Pt will demonstrate adequate respiratory support 80% of the time in 10 minute conversation   Status Achieved     SLP LONG TERM GOAL #2   Title Pt will demonstrate improvement in s:z ratio   Time 7   Period Weeks   Status On-going     SLP LONG TERM GOAL #3   Title Pt will demonstrate improvement in VHI and VRQOL   Time 7   Period Weeks   Status On-going     SLP LONG TERM GOAL #4   Title Pt will demonstrate vocal function exercises with good technique    Time 7   Period Weeks   Status Revised     SLP LONG TERM GOAL #5   Title Pt will  exhibit voicing without tremor in 15/20 sentence length responses   Time 7   Period Weeks   Status On-going          Plan - 01/05/17 1142    Clinical Impression Statement Pt reports adherence to exercises and techniques discussed last session. ENT consult scheduled for this Thursday 01/07/17 to rule out unknown variables that may be amenable to voice therapy. Given pt independence with vocal hygiene, breathing and relaxation techniques, anticipate DC following ENT visit if no issues are discovered during that visit. Pt is agreeable to this plan, and will call SLP with ENT results and recommendations.    Speech Therapy Frequency 1x /week   Duration --  possible DC after ENT. 8 weeks currently scheduled   Treatment/Interventions SLP instruction and feedback;Functional tasks;Compensatory strategies;Internal/external aids;Patient/family education   Potential to Achieve Goals Good   Potential Considerations Ability to learn/carryover information;Family/community support;Cooperation/participation level   SLP Home Exercise Plan reviewed and practiced with pt   Consulted and Agree with Plan of Care Patient      Patient will benefit from skilled therapeutic intervention in order to improve the following deficits and impairments:   Other voice and resonance disorders    Problem List Patient Active Problem List   Diagnosis Date Noted  . 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 Special screening for malignant neoplasms, colon 02/19/2016  . Change in bowel habits 02/19/2016  . Blood in stool 02/19/2016  . Loss of weight 02/19/2016  . Essential tremor 05/23/2013   Luka Reisch B. Quentin Ore [facility], CCC-SLP Speech Language Pathologist  Shonna Chock 01/05/2017, 11:48 AM  [facility] [address] [place], [place], [address] Phone: [phone]   Fax:  [phone]   Name: Alexis Brady MRN:  [id number] Date of Birth: Sep 12, 1940

## 2017-01-07 DIAGNOSIS — J383 Other diseases of vocal cords: Secondary | ICD-10-CM | POA: Insufficient documentation

## 2017-01-12 ENCOUNTER — Ambulatory Visit: Payer: Medicare Other | Admitting: *Deleted

## 2017-01-12 NOTE — Therapy (Signed)
[facility] [address] [place], [place], [address] Phone: [phone]   Fax:  [phone]  Speech Language Pathology Treatment and Discharge Summary  Patient Details  Name: Alexis Brady MRN: [id number] Date of Birth: 08/15/1940 Referring Provider: Dr. Eustace Quail. Tat  Encounter Date: 01/05/2017    Past Medical History:  Diagnosis Date  . Anemia   . Asthma    only when had bronchitis per pt  . Depression   . Eczema   . Heart murmur   . Obesity   . Osteopenia   . Osteoporosis   . Restrictive lung disease   . Tremor   . Ulcerative colitis [facility])     Past Surgical History:  Procedure Laterality Date  . 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 Given pt's concern for laryngeal cancer (nephew recently diagnosed with throat cancer), her history of vocal fold nodules, and current vocal tremor, referral was made to ENT for objective assessment of vocal fold function to identify if continued speech therapy was warranted. Results of ENT visit noted. Pt contacted SLP to verbalize appreciation for services rendered, and requesting discharge from skilled ST services at this time.  SLP available if needs arise in the future.    Remaining deficits: Vocal tremor   Education / Equipment: Pt has been given instruction regarding strategies to decrease muscular tension in the neck and shoulders, education regarding adequate breath support and abdominal breathing techniques, and information regarding vocal hygiene/conservation. Pt is independent with use of slower rate and over-articulation to increase intelligibility of speech.  Plan: Patient agrees to discharge.  Patient goals were partially met. Patient is being discharged due to the  patient's request.  ?????            SLP Short Term Goals - 01/05/17 1145      SLP SHORT TERM GOAL #1   Title Pt will use abdominal breathing at rest 85% of the time over 5 minutes,   Status Achieved     SLP SHORT TERM GOAL #2   Title Pt will demonstrate abdominal breathing in 15/20 sentence length responses   Time 3   Period Weeks   Status Not met     SLP SHORT TERM GOAL #3   Title Pt will exhibit "confidential voice" in 15/20 sentence length responses   Time 3   Period Weeks   Status Not met          SLP Long Term Goals - 01/05/17 1147      SLP LONG TERM GOAL #1   Title Pt will demonstrate adequate respiratory support 80% of the time in 10 minute conversation   Status Achieved     SLP LONG TERM GOAL #2   Title Pt will demonstrate improvement in s:z ratio   Time 7   Period Weeks   Status Not met     SLP LONG TERM GOAL #3   Title Pt will demonstrate improvement in VHI and VRQOL   Time 7   Period Weeks   Status Not met     SLP LONG TERM GOAL #4   Title Pt will demonstrate vocal function exercises with good technique    Time 7   Period Weeks   Status Not met     SLP LONG TERM GOAL #5  Title Pt will exhibit voicing without tremor in 15/20 sentence length responses   Time 7   Period Weeks   Status Not met        Patient will benefit from skilled therapeutic intervention in order to improve the following deficits and impairments:   Other voice and resonance disorders    Problem List Patient Active Problem List   Diagnosis Date Noted  . 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 Special screening for malignant neoplasms, colon 02/19/2016  . Change in bowel habits 02/19/2016  . Blood in stool 02/19/2016  . Loss of weight 02/19/2016  . Essential tremor 05/23/2013   Celia B. Quentin O[facility], CCC-SLP Speech Language Pathologist  Shonna Chock 01/12/2017, 11:28 AM  [facility] [address] [place], [place], [address] Phone: [phone]   Fax:  [phone]   Name: Allysia Ingles MRN: [id number] Date of Birth: 19-Feb-1941

## 2017-01-26 ENCOUNTER — Encounter: Payer: Medicare Other | Admitting: *Deleted

## 2017-02-09 ENCOUNTER — Other Ambulatory Visit: Payer: Self-pay

## 2017-02-09 ENCOUNTER — Encounter: Payer: Medicare Other | Admitting: *Deleted

## 2017-02-09 MED ORDER — MESALAMINE 1.2 G PO TBEC: DELAYED_RELEASE_TABLET | ORAL | 0 refills | Status: DC

## 2017-02-15 ENCOUNTER — Other Ambulatory Visit: Payer: Self-pay | Admitting: Neurology

## 2017-02-16 ENCOUNTER — Encounter: Payer: Medicare Other | Admitting: *Deleted

## 2017-02-17 ENCOUNTER — Telehealth: Payer: Self-pay | Admitting: Neurology

## 2017-02-17 NOTE — Telephone Encounter (Signed)
[facility] lmom to have someone call regarding this patient. The Reference # is [id number]. Thanks

## 2017-02-17 NOTE — Telephone Encounter (Signed)
[facility] called for refill of Topamax. Aware patient needs appt.

## 2017-02-23 ENCOUNTER — Encounter: Payer: Medicare Other | Admitting: *Deleted

## 2017-03-02 ENCOUNTER — Encounter: Payer: Medicare Other | Admitting: *Deleted

## 2017-03-17 ENCOUNTER — Telehealth: Payer: Self-pay | Admitting: Gastroenterology

## 2017-03-17 MED ORDER — MESALAMINE 1.2 G PO TBEC: DELAYED_RELEASE_TABLET | ORAL | 0 refills | Status: DC

## 2017-03-17 NOTE — Telephone Encounter (Signed)
Prescription sent to patient's pharmacy and patient notified to keep appt for further refills

## 2017-03-30 ENCOUNTER — Telehealth: Payer: Self-pay | Admitting: Gastroenterology

## 2017-03-30 NOTE — Telephone Encounter (Signed)
Left a message for patient to return my call. 

## 2017-03-31 ENCOUNTER — Other Ambulatory Visit: Payer: Self-pay | Admitting: Gastroenterology

## 2017-03-31 NOTE — Telephone Encounter (Signed)
Informed patient we do not have samples of Lialda in the office any longer since the medication went generic. Patient states she has been out of this medication for 2 weeks. Informed patient we sent a refill to her pharmacy when she called on 03/17/17. Patient states she never got a call from the pharmacy stating the medication was ready. She called the pharmacy last week and they told her that her insurance company will not pay for the medication until 04/18/17. Informed patient that she needs to either find out how much her cost would be for this medication with her pharmacy or call her insurance company and find out what preferred medication her insurance will pay for in the place of Lialda. Patient states she will call the pharmacy and call us back.

## 2017-03-31 NOTE — Telephone Encounter (Signed)
Patient left a voicemail stating she spoke with the pharmacy and they are investigating why her insurance will not cover [facility] until after 04/18/17 and she will call let me know when she finds out.

## 2017-05-03 ENCOUNTER — Encounter: Payer: Self-pay | Admitting: Gastroenterology

## 2017-05-03 ENCOUNTER — Ambulatory Visit: Payer: Medicare Other | Admitting: Gastroenterology

## 2017-05-03 VITALS — BP 128/70 | HR 80 | Ht 61.0 in | Wt 152.4 lb

## 2017-05-03 DIAGNOSIS — K5130 Ulcerative (chronic) rectosigmoiditis without complications: Secondary | ICD-10-CM | POA: Diagnosis not present

## 2017-05-03 MED ORDER — MESALAMINE 1.2 G PO TBEC: DELAYED_RELEASE_TABLET | ORAL | 1 refills | Status: DC

## 2017-05-03 NOTE — Patient Instructions (Addendum)
We have sent the following prescriptions to your mail in pharmacy:Lialda   If you have not heard from your mail in pharmacy within 1 week or if you have not received your medication in the mail, please contact us at [phone] so we may find out why.  .   Normal BMI (Body Mass Index- based on height and weight) is between 23 and 30. Your BMI today is Body mass index is 28.79 kg/m. Marland Kitchen Please consider follow up  regarding your BMI with your Primary Care Provider.  Thank you for choosing me and [facility].  Pricilla Riffle. Dagoberto Ligas., MD., [facility] Regal

## 2017-05-03 NOTE — Progress Notes (Signed)
    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  She relates that for the past 3 months she has been unable to afford her medication and has not been taking mesalamine.  She relates that as late January mesalamine was better covered by her insurance provider and she started taking mesalamine on January 29.  She relates having loose frequent and poorly formed stools for the past several months.  The symptoms have improved since resuming mesalamine.   Current Medications, Allergies, Past Medical History, Past Surgical History, Family History and Social History were reviewed in [employer] record.  Physical Exam: General: Well developed, well nourished, no acute distress Head: Normocephalic and atraumatic Eyes:  sclerae anicteric, EOMI Ears: Normal auditory acuity Mouth: No deformity or lesions Lungs: Clear throughout to auscultation Heart: Regular rate and rhythm; no murmurs, rubs or bruits Abdomen: Soft, non tender and non distended. No masses, hepatosplenomegaly or hernias noted. Normal Bowel sounds Rectal: Not done Musculoskeletal: Symmetrical with no gross deformities  Pulses:  Normal pulses noted Extremities: No clubbing, cyanosis, edema or deformities noted Neurological: Alert oriented x 4, grossly nonfocal Psychological:  Alert and cooperative. Normal mood and affect  Assessment and Recommendations:   1. Ulcerative proctosigmoiditis.  Flare of symptoms off mesalamine.  Continue Lialda 2.4 g daily.  Long-term medication compliance was stressed.  Reserve budesonide for flares not responding to ongoing mesalamine usage. At this point I would like to give the mesalamine several more weeks to work without using budesonide.  She is advised to call if her bowel movements have not returned to normal within 1 month. REV in 6 months.

## 2017-05-26 ENCOUNTER — Telehealth: Payer: Self-pay | Admitting: Neurology

## 2017-05-26 NOTE — Telephone Encounter (Signed)
Patient last seen in April 2018. Was told to follow up 4-5 months. Please advise if okay to refill until scheduled appt in May.

## 2017-05-26 NOTE — Telephone Encounter (Signed)
Pt needs a refill of Topiramate called in, scheduled to see Dr Tat on 08/18/2017

## 2017-05-27 ENCOUNTER — Encounter: Payer: Self-pay | Admitting: Neurology

## 2017-05-27 ENCOUNTER — Ambulatory Visit: Payer: Medicare Other | Admitting: Neurology

## 2017-05-27 VITALS — BP 114/60 | HR 76 | Ht 61.0 in | Wt 153.0 lb

## 2017-05-27 DIAGNOSIS — G250 Essential tremor: Secondary | ICD-10-CM

## 2017-05-27 DIAGNOSIS — R634 Abnormal weight loss: Secondary | ICD-10-CM | POA: Diagnosis not present

## 2017-05-27 MED ORDER — PRIMIDONE 50 MG PO TABS: 50.0000 mg | ORAL_TABLET | Freq: Every day | ORAL | 1 refills | Status: DC

## 2017-05-27 MED ORDER — TOPIRAMATE 100 MG PO TABS: 100.0000 mg | ORAL_TABLET | Freq: Every day | ORAL | 0 refills | Status: DC

## 2017-05-27 NOTE — Telephone Encounter (Signed)
Only until f/u.

## 2017-05-27 NOTE — Progress Notes (Signed)
Subjective:    Alexis Brady was seen in consultation in the movement disorder clinic at the request of Marton Redwood, MD.  The evaluation is for tremor.  Tremor started in the hands about 15 years ago.  It was in the bilateral hands, L greater than R.  She was seen in [place] for the tremor.  It got worse over the last 2 years.  She is now notices voice and head tremor.  Her signature and writing bothers her the most now.  she does admit that the tremor bothers her family more than it bothers her and her children question whether she has PD.   She has trouble flipping pancakes.  She was recently tried on primidone 50 mg; she took it for 2 months and it didn't seem to help so she d/c it.  She wasn't sure that she liked the way that it made her feel. There is a family hx of tremor in her older sister.    03/01/15 update:  The patient is following up today.  I have not seen her in almost 2 years.  She has a history of essential tremor.  Last visit, she opted to take no medication.  She called me a few days after I saw her last and asked me about trying some type of vitamin for essential tremor, which I did not recommend.  We talked about Topamax last visit and ultimately she decided not to try it.  She had tried low-dose primidone in the past and did not find it effective and thought it made her a little dizzy.  She is not a beta blocker candidate because of asthma.  She does tell me that her PCP gave her a medication but it made her sleepy; however, she has no idea what that medication was.  Upon naming some, she thinks that it was primidone again.  She is having more trouble with vocal tremor and had some head tremor as well.  She is spilling soup and coffee and can barely read her handwriting.  Her handwriting is actually worse in the morning than the evening.  She just started albuterol and it has not affected tremor.    08/02/15 update:  The patient is following up today regarding essential tremor.  I  last saw her on December 9, at which point we started her on Topamax for essential tremor.  On December 27, she went to the emergency room with right flank pain.  The CT was negative for a stone, but suspicious for a recently passed stone.  She has been good since then.  She has noted more tremor in the voice and isn't sure if that is progression of the disease but never really noted much vocal tremor before.  She doesn't think that it has helped hand tremor that much but has been very happy with weight loss.  The topamax has caused her to have a metallic taste in the mouth.     01/03/16 update:  The patient follows up today regarding essential tremor.  She is on topiramate, 100 mg daily.  She has had no further episodes of kidney stones.  She does understand the risk.  She has dropped 13 lbs since last visit but "I feel good."  Appetite is less than it was.  Noted more tremor in the voice lately.  Had tremor is same as last visit.   States that she just had birthday and went to the [facility] and did tubing and white water rafting and  plans to sky dive next year.  07/08/16 update:  Patient seen today in follow-up.  She remains on topiramate, 100 mg daily.  No further kidney stones.  Denies falls.  Tremor has been stable.  Denies lightheadedness or near syncope.  Thought that she had colon CA but was dx with colitis instead.  States that it is not bothering her.  Pt was disappointed that the cardiologist told her she couldn't go sky diving.    05/27/17 update: Patient was seen as a work in today.  I have not seen her in about a year.  She is running out of her topiramate and follows up because of that.  She is on 100 mg daily.  She has had no further episodes of nephrolithiasis.  Records have been reviewed since last visit.  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  She had run out of her medications for 3 months.  States that she does plan on going skydiving after August.  She  looked into vocal cord botox since our last visit.  States that she just cannot afford that.  She brings a video of focused u/s and asks about that.    Current/Previously tried tremor medications: primidone (low dose, no help but she didn't like the way it made her feel - little dizzy)  Current medications that may exacerbate tremor:  Albuterol but almost never uses it  Outside reports reviewed: historical medical records and referral letter/letters.  No Known Allergies  Current Outpatient Medications on File Prior to Visit  Medication Sig Dispense Refill  . aspirin EC 81 MG tablet Take 81 mg by mouth daily.    . calcium citrate-vitamin D (CITRACAL+D) 315-200 MG-UNIT per tablet Take 1 tablet by mouth daily.     . cholecalciferol (VITAMIN D) 400 UNITS TABS tablet Take 400 Units by mouth daily.    Marland Kitchen co-enzyme Q-10 30 MG capsule Take 100 mg by mouth daily.    Marland Kitchen ibuprofen (ADVIL,MOTRIN) 200 MG tablet Take 400 mg by mouth 2 (two) times daily.     . Melatonin 3 MG TABS Take by mouth at bedtime.    . mesalamine (LIALDA) 1.2 g EC tablet TAKE 2 TABLETS BY MOUTH  DAILY WITH BREAKFAST 180 tablet 1  . topiramate (TOPAMAX) 100 MG tablet Take 1 tablet (100 mg total) by mouth daily. 90 tablet 0  . vitamin B-12 (CYANOCOBALAMIN) 1000 MCG tablet Take 1,000 mcg by mouth daily.     Current Facility-Administered Medications on File Prior to Visit  Medication Dose Route Frequency Provider Last Rate Last Dose  . 0.9 %  sodium chloride infusion  500 mL Intravenous Continuous Ladene Artist, MD        Past Medical History:  Diagnosis Date  . Anemia   . Asthma    only when had bronchitis per pt  . Depression   . Eczema   . Heart murmur   . Obesity   . Osteopenia   . Osteoporosis   . Restrictive lung disease   . Tremor   . Ulcerative colitis [facility])     Past Surgical History:  Procedure Laterality Date  . VOCAL CORD INJECTION      Social History   Socioeconomic History  . Marital status:  Married    Spouse name: Not on file  . Number of children: 2  . Years of education: Not on file  . Highest education level: Not on file  Social Needs  . Financial  resource strain: Not on file  . Food insecurity - worry: Not on file  . Food insecurity - inability: Not on file  . Transportation needs - medical: Not on file  . Transportation needs - non-medical: Not on file  Occupational History  . Not on file  Tobacco Use  . Smoking status: Former Smoker    Last attempt to quit: 05/16/1983    Years since quitting: 34.0  . Smokeless tobacco: Never Used  Substance and Sexual Activity  . Alcohol use: No  . Drug use: No  . Sexual activity: Not on file  Other Topics Concern  . Not on file  Social History Narrative  . Not on file    Family Status  Relation Name Status  . Mother  Deceased at age 95       CVA  . Sister  Alive       Diabetes, HTN, Fibromyalgia  . Son  Alive       healthy  . Son  Deceased at age 75       MVA  . Father  Deceased at age 77       CHF, MI  . Mat Aunt  Deceased  . Cousin  Alive  . Sister  Alive    Review of Systems A complete 10 system ROS was obtained and was negative apart from what is mentioned.   Objective:   VITALS:   Vitals:   05/27/17 1109  BP: 114/60  Pulse: 76  SpO2: 96%  Weight: 153 lb (69.4 kg)  Height: 5' 1"  (1.549 m)   Wt Readings from Last 3 Encounters:  05/27/17 153 lb (69.4 kg)  05/03/17 152 lb 6 oz (69.1 kg)  10/21/16 153 lb (69.4 kg)    Gen:  Appears stated age and in NAD. HEENT:  Normocephalic, atraumatic. The mucous membranes are moist. The superficial temporal arteries are without ropiness or tenderness. Cardiovascular: Regular rate and rhythm with 3/6 SEM (radiates to bilateral carotids, L more than R) Lungs: Clear to auscultation bilaterally. Neck: There are no carotid bruits noted bilaterally.   Gen:  Appears stated age and in NAD. HEENT:  Normocephalic, atraumatic. The mucous membranes are moist. The  superficial temporal arteries are without ropiness or tenderness. Cardiovascular: Regular rate and rhythm. Lungs: Clear to auscultation bilaterally. Neck: There are no carotid bruits noted bilaterally.  NEUROLOGICAL:  Orientation:  The patient is alert and oriented x 3.  Recent and remote memory are intact.  Attention span and concentration are normal.  Able to name objects and repeat without trouble.  Fund of knowledge is appropriate Cranial nerves: There is good facial symmetry.  Extraocular muscles are intact and visual fields are full to confrontational testing. Speech is fluent and clear. Soft palate rises symmetrically and there is no tongue deviation. Hearing is intact to conversational tone. Tone: Tone is good throughout. Sensation: Sensation is intact to light touch throughout Coordination:  The patient has no difficulty with RAM's or FNF bilaterally. Motor: Strength is 5/5 in the bilateral upper and lower extremities.  Shoulder shrug is equal bilaterally.  There is no pronator drift.  There are no fasciculations noted. Gait and Station: The patient is able to ambulate without difficulty.    MOVEMENT EXAM: Tremor:  There is minimal tremor of outstretched hands.  Some trouble with archimedes spirals on the right.   There is vocal tremor  Intermittent head and jaw tremor. There is no null point.       Assessment/Plan:  1.  Essential Tremor.  -This is evidenced by the symmetrical nature and longstanding hx of gradually getting worse.  I don't think that this represents dystonic tremor as there is no null point.  Unfortunately, therefore, botox will not help the neck.  I think that botox could potentially help the vocal tremor/spasmodic dysphonia and she did seek consultation for that but felt it may be unaffordable.  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  We talked about r/b/se of botox and it may be worthwhile for her to trial this at least one time to  see effectiveness.    -she asked about focused u/s.  Told her that it wouldn't help vocal tremor.  Explained how its done and differences between this and DBS.  I would not recommend this for her  -she asked me about DBS surgery.  She had multiple questions and expresses interest and will think about it.  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    -she would like to continue topamax.  No further kidney stones.  -she would like to try primidone.  She was on it years ago but thought it didn't work, and may have made her dizzy starting at 100 mg daily.  Will try at 50 mg at night.  Risks, benefits, side effects and alternative therapies were discussed.  The opportunity to ask questions was given and they were answered to the best of my ability.  The patient expressed understanding and willingness to follow the outlined treatment protocols.  -stress of having husband with memory loss is becoming more burdensome.  She asks several questions about him/this. 2.  Weight loss  -stablized.  She asked me to show her trend of weights and I did that 3.  Follow up is anticipated in the next few months, sooner should new neurologic issues arise.  Much greater than 50% of this visit was spent in counseling and coordinating care.  Total face to face time:  35 min

## 2017-05-27 NOTE — Telephone Encounter (Signed)
RX sent to pharmacy  

## 2017-05-27 NOTE — Patient Instructions (Signed)
1.  Start primidone - 50 mg - 1/2 tablet each night for 4 nights then go to 1 tablet nightly 2.  Continue topamax - 141m daily

## 2017-05-31 ENCOUNTER — Telehealth: Payer: Self-pay | Admitting: Neurology

## 2017-05-31 NOTE — Telephone Encounter (Signed)
Pt called and said she took a whole pill instead of a half pill of Primidone and she feels tired now, please call and advise about the dosage

## 2017-05-31 NOTE — Telephone Encounter (Signed)
Spoke with patient. She states she forgot to start Primidone with half pills and started with a whole pill last night. She is feeling tired today.   I advised her to go back to 1/2 tablet for 4 days. Informed about first dose effect. If still having issues with tiredness after being on full dose after titration she will let us know.

## 2017-05-31 NOTE — Telephone Encounter (Signed)
Left message on machine for patient to call back.

## 2017-06-10 ENCOUNTER — Other Ambulatory Visit: Payer: Self-pay | Admitting: Internal Medicine

## 2017-06-10 DIAGNOSIS — Z139 Encounter for screening, unspecified: Secondary | ICD-10-CM

## 2017-06-24 ENCOUNTER — Telehealth: Payer: Self-pay | Admitting: Neurology

## 2017-06-24 NOTE — Telephone Encounter (Signed)
Will await patient's call back.

## 2017-06-24 NOTE — Telephone Encounter (Signed)
Pt left a message she was returning Jade's call

## 2017-06-24 NOTE — Telephone Encounter (Signed)
Patient returning your call according to her VM she will be leaving at 2:50 to pick up her grandson and will be back at home around 4:00 please call her then.

## 2017-06-24 NOTE — Telephone Encounter (Signed)
Patient called and Lmom stating that she needing to speak with you but did not say what it was regarding. I left a message for her to call back to get more information. Thanks

## 2017-06-24 NOTE — Telephone Encounter (Signed)
Left message on machine for patient to call back.

## 2017-06-25 ENCOUNTER — Ambulatory Visit: Payer: Medicare Other | Admitting: Neurology

## 2017-06-25 NOTE — Telephone Encounter (Signed)
Patient is returning [place] phone call she left message on the VM

## 2017-06-25 NOTE — Telephone Encounter (Signed)
Spoke with patient - she states she has stayed on 1/2 tablets of Primidone since she got so dizzy/lightheaded with the first dose she took (she accidentally started with whole tablets) that she was nervous to increase.   She states tremor is somewhat improved in hands. No change in vocal tremor (aware meds will probably not help with that). She wants to know if she should try increasing medication. I made her aware she can try to take a whole pill one day where she doesn't have things planned in case she gets lightheaded, but also discussed first dose effect, and slowly increasing medication should cut that out. She is taking medication at bedtime.   She will try this and go back to 1/2 tablets if needed and call with any problems.   Dr. Carles Collet - fyi.

## 2017-06-25 NOTE — Telephone Encounter (Signed)
agree

## 2017-06-29 ENCOUNTER — Ambulatory Visit
Admission: RE | Admit: 2017-06-29 | Discharge: 2017-06-29 | Disposition: A | Discharge status: Home / Self Care | Payer: Medicare Other | Source: Ambulatory Visit | Attending: Internal Medicine | Admitting: Internal Medicine

## 2017-06-29 DIAGNOSIS — Z139 Encounter for screening, unspecified: Secondary | ICD-10-CM

## 2017-08-17 ENCOUNTER — Telehealth: Payer: Self-pay | Admitting: Gastroenterology

## 2017-08-17 MED ORDER — AMBULATORY NON FORMULARY MEDICATION: 3 refills | Status: DC

## 2017-08-17 MED ORDER — BUDESONIDE ER 9 MG PO TB24: 1.0000 | ORAL_TABLET | Freq: Every day | ORAL | 3 refills | Status: DC

## 2017-08-17 NOTE — Telephone Encounter (Signed)
Patient states she has two bottles of medications for her UC and one of them she cannot afford since her husband passed away last week. She states she got one from [employer] and that is the one she needs a refill of. Informed patient that this is her budesonide prescription. Patient states she does take it as needed when her bowels become irregular.  Patient states she would like to shop around and see if she can get the budesonide cheaper at local pharmacies. Informed patient it will probably still be cheaper at [facility] but I will print a prescription and mail it to her. Patient verbalized understanding.

## 2017-08-18 ENCOUNTER — Encounter

## 2017-08-18 ENCOUNTER — Ambulatory Visit: Payer: Medicare Other | Admitting: Neurology

## 2017-08-30 ENCOUNTER — Ambulatory Visit: Payer: Medicare Other | Admitting: Neurology

## 2017-09-10 ENCOUNTER — Other Ambulatory Visit: Payer: Self-pay | Admitting: Neurology

## 2017-09-13 ENCOUNTER — Encounter (INDEPENDENT_AMBULATORY_CARE_PROVIDER_SITE_OTHER): Payer: Self-pay | Admitting: Physician Assistant

## 2017-09-13 ENCOUNTER — Ambulatory Visit (INDEPENDENT_AMBULATORY_CARE_PROVIDER_SITE_OTHER): Payer: Self-pay

## 2017-09-13 ENCOUNTER — Ambulatory Visit (INDEPENDENT_AMBULATORY_CARE_PROVIDER_SITE_OTHER): Payer: Medicare Other | Admitting: Physician Assistant

## 2017-09-13 VITALS — Ht 61.0 in | Wt 153.0 lb

## 2017-09-13 DIAGNOSIS — G8929 Other chronic pain: Secondary | ICD-10-CM | POA: Diagnosis not present

## 2017-09-13 DIAGNOSIS — M25512 Pain in left shoulder: Secondary | ICD-10-CM

## 2017-09-13 MED ORDER — METHYLPREDNISOLONE ACETATE 40 MG/ML IJ SUSP
40.0000 mg | INTRAMUSCULAR | Status: AC | PRN
  Administered 2017-09-13: 40 mg via INTRA_ARTICULAR

## 2017-09-13 MED ORDER — LIDOCAINE HCL 1 % IJ SOLN
3.0000 mL | INTRAMUSCULAR | Status: AC | PRN
  Administered 2017-09-13: 3 mL

## 2017-09-13 NOTE — Progress Notes (Signed)
Office Visit Note   Patient: Alexis Brady           Date of Birth: 1941/03/22           MRN: [id number] Visit Date: 09/13/2017              Requested by: Alexis Redwood, MD [address] [place], [place] [address] PCP: Alexis Redwood, MD   Assessment & Plan: Visit Diagnoses:  1. Chronic left shoulder pain     Plan: We will have her work on pendulum, Cottom and wall crawls left shoulder.  See her back in 2 weeks if she is doing well she can call and cancel the appointment.  Questions encouraged and answered at length today.  Follow-Up Instructions: Return if symptoms worsen or fail to improve.   Orders:  Orders Placed This Encounter  Procedures  . Large Joint Inj: L subacromial bursa  . XR Shoulder Left   No orders of the defined types were placed in this encounter.     Procedures: Large Joint Inj: L subacromial bursa on 09/13/2017 10:00 AM Indications: pain Details: 22 G 1.5 in needle, superior approach  Arthrogram: No  Medications: 3 mL lidocaine 1 %; 40 mg methylPREDNISolone acetate 40 MG/ML Outcome: tolerated well, no immediate complications Procedure, treatment alternatives, risks and benefits explained, specific risks discussed. Consent was given by the patient. Immediately prior to procedure a time out was called to verify the correct patient, procedure, equipment, support staff and site/side marked as required. Patient was prepped and draped in the usual sterile fashion.       Clinical Data: No additional findings.   Subjective: Chief Complaint  Patient presents with  . Left Shoulder - Pain    HPI Alexis Brady comes in today with left shoulder pain has been ongoing for a while.  Unfortunately her husband just passed away 5 weeks ago.  She is been caring for him and set at time for her self particularly her left shoulder pain.  She notes she has difficulty dressing and any activity above her head or behind her shoulder.  She had similar problems with  her right shoulder back in 2018 and had a cortisone injection and this resolved her pain.  She has had no acute falls or injuries.  No numbness tingling down the left arm. Review of Systems Please see HPI otherwise negative  Objective: Vital Signs: Ht 5' 1"  (1.549 m)   Wt 153 lb (69.4 kg)   BMI 28.91 kg/m   Physical Exam  Constitutional: She is oriented to person, place, and time. She appears well-developed and well-nourished. She appears distressed.  Pulmonary/Chest: Effort normal.  Neurological: She is alert and oriented to person, place, and time.  Skin: She is not diaphoretic.  Psychiatric: She has a normal mood and affect.    Ortho Exam Bilateral shoulders she has 5 out of 5 strengths against resistance with internal and external rotation.  Empty can test is negative bilaterally.  She has a positive impingement test on the left negative on the right.  She is able to ratchet the left shoulder up to 180 degrees forward flexion but has pain with this. Specialty Comments:  No specialty comments available.  Imaging: Xr Shoulder Left  Result Date: 09/13/2017 Left shoulder 3 views: No acute fracture.  Glenohumeral joint is well-maintained.  Subacromial space is well-maintained on the Y view.  Shoulders well located.    PMFS History: Patient Active Problem List   Diagnosis Date Noted  . 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 Special screening for malignant neoplasms, colon 02/19/2016  . Change in bowel habits 02/19/2016  . Blood in stool 02/19/2016  . Loss of weight 02/19/2016  . Essential tremor 05/23/2013   Past Medical History:  Diagnosis Date  . Anemia   . Asthma    only when had bronchitis per pt  . Depression   . Eczema   . Heart murmur   . Obesity   . Osteopenia   . Osteoporosis   . Restrictive lung disease   . Tremor   . Ulcerative colitis ([place])     Family History  Problem Relation Age of Onset  . CVA Mother   . Fibromyalgia  Sister   . Diabetes Sister   . Hypertension Sister   . Heart attack Father   . Heart failure Father   . Breast cancer Maternal Aunt   . Breast cancer Cousin   . Hypertension Sister   . Fibromyalgia Sister   . Arrhythmia Sister        defib    Past Surgical History:  Procedure Laterality Date  . VOCAL CORD INJECTION     Social History   Occupational History  . Not on file  Tobacco Use  . Smoking status: Former Smoker    Last attempt to quit: 05/16/1983    Years since quitting: 34.3  . Smokeless tobacco: Never Used  Substance and Sexual Activity  . Alcohol use: No  . Drug use: No  . Sexual activity: Not on file

## 2017-09-15 ENCOUNTER — Ambulatory Visit: Payer: Self-pay | Admitting: Neurology

## 2017-09-15 ENCOUNTER — Ambulatory Visit (INDEPENDENT_AMBULATORY_CARE_PROVIDER_SITE_OTHER): Payer: Medicare Other | Admitting: Neurology

## 2017-09-15 ENCOUNTER — Encounter: Payer: Self-pay | Admitting: Neurology

## 2017-09-15 VITALS — BP 133/76 | HR 66 | Ht 61.0 in | Wt 144.2 lb

## 2017-09-15 DIAGNOSIS — J383 Other diseases of vocal cords: Secondary | ICD-10-CM | POA: Diagnosis not present

## 2017-09-15 DIAGNOSIS — J385 Laryngeal spasm: Secondary | ICD-10-CM | POA: Diagnosis not present

## 2017-09-15 NOTE — Progress Notes (Signed)
**  Botox 100 units x 1 vials, NDC 2162-4469-50, Lot [id number], Exp 01/2020, medication provided from Dr. Redmond Baseman' office.//mck,rn**

## 2017-09-15 NOTE — Progress Notes (Signed)
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 Redmond Baseman, see separate injection note by Dr. Redmond Baseman,  Patient tolerated injection well,

## 2017-10-04 ENCOUNTER — Ambulatory Visit (INDEPENDENT_AMBULATORY_CARE_PROVIDER_SITE_OTHER): Payer: Medicare Other | Admitting: Physician Assistant

## 2017-10-04 ENCOUNTER — Encounter (INDEPENDENT_AMBULATORY_CARE_PROVIDER_SITE_OTHER): Payer: Self-pay | Admitting: Physician Assistant

## 2017-10-04 DIAGNOSIS — M25512 Pain in left shoulder: Secondary | ICD-10-CM | POA: Diagnosis not present

## 2017-10-04 DIAGNOSIS — G8929 Other chronic pain: Secondary | ICD-10-CM

## 2017-10-04 NOTE — Progress Notes (Signed)
HPI: Mrs.Orton returns today for follow-up of her left shoulder status post subacromial injection 09/13/2017.  She states the injection helped.  She states she had a big improvement in her overall range of motion of the shoulder.  Some pain still with extreme abduction.  Overall she is happy with the results.  She is also had a Botox injection vocal cords which is helped with her essential tremor and her speech.  Review of systems: Please see HPI otherwise negative  Physical exam: Shoulders 5 out of 5 strength external and internal rotation against resistance.  Negative impingement testing.  She is able to bring her arm up to forward flexion 180 degrees on the left without any pain and she does not ratchet to do this any longer.  Abduction left shoulder causes some discomfort.  Good range of motion left elbow without pain.  Impression: Left shoulder pain  Plan: She is given Thera-Band which she can perform internal/external rotation exercises with the knees are shown to her today.  She will continue exercises shown at last office visit.  She will follow-up on an as-needed basis.  However if her pain returns or becomes worse she can call the office and we will obtain an MRI of her left shoulder rule out cuff tear.  She is encouraged and answered

## 2017-10-20 ENCOUNTER — Telehealth: Payer: Self-pay | Admitting: Gastroenterology

## 2017-10-20 MED ORDER — MESALAMINE 1.2 G PO TBEC: DELAYED_RELEASE_TABLET | ORAL | 1 refills | Status: DC

## 2017-10-20 NOTE — Telephone Encounter (Signed)
Patient states she needs medication mesalamine refilled at [facility]. Patient last ov was 2.11.19.

## 2017-10-20 NOTE — Telephone Encounter (Signed)
Informed patient to schedule an appointment for February but to call our back if she has any GI symptoms. Prescription sent to [facility].

## 2017-10-20 NOTE — Telephone Encounter (Signed)
If her symptoms are controlled we will go to yearly visits per her request. If her symptoms flare she should contact us for further advice. OK to refill Lialda until Feb office appt.

## 2017-10-20 NOTE — Telephone Encounter (Signed)
Patient states she is on a fixed income since her husbands passing and cannot come in to the office every 6 months. She would like to come in for yearly appointments since she cannot afford the co-pay to come in right now. Dr. Fuller Plan can patient get refills until February and schedule appt for February?

## 2017-11-17 NOTE — Progress Notes (Signed)
Subjective:    Alexis Brady was seen in consultation in the movement disorder clinic at the request of Marton Redwood, MD.  The evaluation is for tremor.  Tremor started in the hands about 15 years ago.  It was in the bilateral hands, L greater than R.  She was seen in [place] for the tremor.  It got worse over the last 2 years.  She is now notices voice and head tremor.  Her signature and writing bothers her the most now.  she does admit that the tremor bothers her family more than it bothers her and her children question whether she has PD.   She has trouble flipping pancakes.  She was recently tried on primidone 50 mg; she took it for 2 months and it didn't seem to help so she d/c it.  She wasn't sure that she liked the way that it made her feel. There is a family hx of tremor in her older sister.    03/01/15 update:  The patient is following up today.  I have not seen her in almost 2 years.  She has a history of essential tremor.  Last visit, she opted to take no medication.  She called me a few days after I saw her last and asked me about trying some type of vitamin for essential tremor, which I did not recommend.  We talked about Topamax last visit and ultimately she decided not to try it.  She had tried low-dose primidone in the past and did not find it effective and thought it made her a little dizzy.  She is not a beta blocker candidate because of asthma.  She does tell me that her PCP gave her a medication but it made her sleepy; however, she has no idea what that medication was.  Upon naming some, she thinks that it was primidone again.  She is having more trouble with vocal tremor and had some head tremor as well.  She is spilling soup and coffee and can barely read her handwriting.  Her handwriting is actually worse in the morning than the evening.  She just started albuterol and it has not affected tremor.    08/02/15 update:  The patient is following up today regarding essential tremor.  I  last saw her on December 9, at which point we started her on Topamax for essential tremor.  On December 27, she went to the emergency room with right flank pain.  The CT was negative for a stone, but suspicious for a recently passed stone.  She has been good since then.  She has noted more tremor in the voice and isn't sure if that is progression of the disease but never really noted much vocal tremor before.  She doesn't think that it has helped hand tremor that much but has been very happy with weight loss.  The topamax has caused her to have a metallic taste in the mouth.     01/03/16 update:  The patient follows up today regarding essential tremor.  She is on topiramate, 100 mg daily.  She has had no further episodes of kidney stones.  She does understand the risk.  She has dropped 13 lbs since last visit but "I feel good."  Appetite is less than it was.  Noted more tremor in the voice lately.  Had tremor is same as last visit.   States that she just had birthday and went [facility] and did tubing and white water rafting and  plans to sky dive next year.  07/08/16 update:  Patient seen today in follow-up.  She remains on topiramate, 100 mg daily.  No further kidney stones.  Denies falls.  Tremor has been stable.  Denies lightheadedness or near syncope.  Thought that she had colon CA but was dx with colitis instead.  States that it is not bothering her.  Pt was disappointed that the cardiologist told her she couldn't go sky diving.    05/27/17 update: Patient was seen as a work in today.  I have not seen her in about a year.  She is running out of her topiramate and follows up because of that.  She is on 100 mg daily.  She has had no further episodes of nephrolithiasis.  Records have been reviewed since last visit.  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  She had run out of her medications for 3 months.  States that she does plan on going skydiving after August.  She  looked into vocal cord botox since our last visit.  States that she just cannot afford that.  She brings a video of focused u/s and asks about that.    11/18/17 update: Patient was seen today in follow-up for essential tremor.  She was started on primidone, 50 mg daily last visit.  She s only on 1/2 tablet as she initially didn't titrate up on it and had SE with full pill.  It hasn't helped at 25 mg.  She is willing to try to go up on the medication when she doesn't have her grandkids (weekend).  She is also still on topamax for tremor.  Records are reviewed since last visit.  She saw Dr. Krista Blue who assisted with EMG guided Botox for the vocal cords, which was ultimately done by ENT (Dr. Redmond Baseman).  States that she was told by Dr. Redmond Baseman she got a "medium dose" but ended up with a raspy voice.  She goes back in December.  She is happy with the fact that she can say 3 syllable words without having to breathe.  She wants to try botox one more time at a lower dosage.  Her husband died 3 months ago and that has affected her income, so that is an issue with paying for botox.  Her friend is paying for it next time.    Current/Previously tried tremor medications: primidone (low dose, no help but she didn't like the way it made her feel - little dizzy)  Current medications that may exacerbate tremor:  Albuterol but almost never uses it  Outside reports reviewed: historical medical records and referral letter/letters.  No Known Allergies  Current Outpatient Medications on File Prior to Visit  Medication Sig Dispense Refill  . aspirin EC 81 MG tablet Take 81 mg by mouth daily.    . calcium citrate-vitamin D (CITRACAL+D) 315-200 MG-UNIT per tablet Take 1 tablet by mouth 2 (two) times daily.     . cholecalciferol (VITAMIN D) 400 UNITS TABS tablet Take 400 Units by mouth daily.    Marland Kitchen co-enzyme Q-10 30 MG capsule Take 100 mg by mouth daily.    Marland Kitchen ibuprofen (ADVIL,MOTRIN) 200 MG tablet Take 400 mg by mouth 2 (two) times  daily.     . Melatonin 3 MG TABS Take by mouth at bedtime.    . mesalamine (LIALDA) 1.2 g EC tablet TAKE 2 TABLETS BY MOUTH  DAILY WITH BREAKFAST 180 tablet 1  . OnabotulinumtoxinA (BOTOX IM) Inject  into the muscle every 3 (three) months.    . topiramate (TOPAMAX) 100 MG tablet TAKE 1 TABLET(100 MG) BY MOUTH DAILY 90 tablet 1  . vitamin B-12 (CYANOCOBALAMIN) 1000 MCG tablet Take 1,000 mcg by mouth daily.     No current facility-administered medications on file prior to visit.     Past Medical History:  Diagnosis Date  . Anemia   . Asthma    only when had bronchitis per pt  . Depression   . Eczema   . Heart murmur   . Laryngeal spasm   . Obesity   . Osteopenia   . Osteoporosis   . Restrictive lung disease   . Tremor   . Ulcerative colitis [facility])     Past Surgical History:  Procedure Laterality Date  . VOCAL CORD INJECTION      Social History   Socioeconomic History  . Marital status: Married    Spouse name: Not on file  . Number of children: 2  . Years of education: two years college  . Highest education level: Not on file  Occupational History  . Occupation: Retired  Scientific laboratory technician  . Financial resource strain: Not on file  . Food insecurity:    Worry: Not on file    Inability: Not on file  . Transportation needs:    Medical: Not on file    Non-medical: Not on file  Tobacco Use  . Smoking status: Former Smoker    Last attempt to quit: 05/16/1983    Years since quitting: 34.5  . Smokeless tobacco: Never Used  Substance and Sexual Activity  . Alcohol use: No  . Drug use: No  . Sexual activity: Not on file  Lifestyle  . Physical activity:    Days per week: Not on file    Minutes per session: Not on file  . Stress: Not on file  Relationships  . Social connections:    Talks on phone: Not on file    Gets together: Not on file    Attends religious service: Not on file    Active member of club or organization: Not on file    Attends meetings of clubs or  organizations: Not on file    Relationship status: Not on file  . Intimate partner violence:    Fear of current or ex partner: Not on file    Emotionally abused: Not on file    Physically abused: Not on file    Forced sexual activity: Not on file  Other Topics Concern  . Not on file  Social History Narrative   Lives at home alone.   No caffeine use.   Right-handed.    Family Status  Relation Name Status  . Mother  Deceased at age 7       CVA  . Sister  Alive       Diabetes, HTN, Fibromyalgia  . Son  Alive       healthy  . Son  Deceased at age 45       MVA  . Father  Deceased at age 35       CHF, MI  . Mat Aunt  Deceased  . Cousin  Alive  . Sister  Alive    Review of Systems A complete 10 system ROS was obtained and was negative apart from what is mentioned.   Objective:   VITALS:   Vitals:   11/18/17 1314  BP: (!) 98/58  Pulse: 70  SpO2: 97%  Weight: 140  lb (63.5 kg)  Height: 5' 1"  (1.549 m)   Wt Readings from Last 3 Encounters:  11/18/17 140 lb (63.5 kg)  09/15/17 144 lb 4 oz (65.4 kg)  09/13/17 153 lb (69.4 kg)    GEN:  The patient appears stated age and is in NAD. HEENT:  Normocephalic, atraumatic.  The mucous membranes are moist. The superficial temporal arteries are without ropiness or tenderness. CV:  RRR Lungs:  CTAB Neck/HEME:  There are no carotid bruits bilaterally.  Neurological examination:  Orientation: The patient is alert and oriented x3. Cranial nerves: There is good facial symmetry. The speech is fluent and clear but hypophonic. Soft palate rises symmetrically and there is no tongue deviation. Hearing is intact to conversational tone. Sensation: Sensation is intact to light touch throughout Motor: Strength is 5/5 in the bilateral upper and lower extremities.   Shoulder shrug is equal and symmetric.  There is no pronator drift.   MOVEMENT EXAM: Tremor:  There is minimal tremor of outstretched hands.  Archimedes spirals are improved  on the right compared to last visit.  She has vocal tremor and her voice is soft and raspy, but vocal tremor is better than last visit.       Assessment/Plan:   1.  Essential Tremor.  -This is evidenced by the symmetrical nature and longstanding hx of gradually getting worse.  She is only on primidone, 25 mg at night.  She did not notice benefit, but there was significant improvement in Archimedes spirals on the right.  Told her to try to increase it to 50 mg.  My suspicion is that she may need to go up further as I think she tried this in the past (she does not recall that) at 100 mg.  We will call her in a month to see how she is doing.   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  3.  Follow up is anticipated in the next 6 months, sooner should new neurologic issues arise.

## 2017-11-18 ENCOUNTER — Ambulatory Visit: Payer: Medicare Other | Admitting: Neurology

## 2017-11-18 ENCOUNTER — Encounter: Payer: Self-pay | Admitting: Neurology

## 2017-11-18 VITALS — BP 98/58 | HR 70 | Ht 61.0 in | Wt 140.0 lb

## 2017-11-18 DIAGNOSIS — G250 Essential tremor: Secondary | ICD-10-CM | POA: Diagnosis not present

## 2017-11-18 DIAGNOSIS — J383 Other diseases of vocal cords: Secondary | ICD-10-CM

## 2017-11-18 MED ORDER — PRIMIDONE 50 MG PO TABS: 50.0000 mg | ORAL_TABLET | Freq: Every day | ORAL | 1 refills | Status: DC

## 2017-11-18 NOTE — Patient Instructions (Signed)
Increase primidone to 1 tablet at bed.  IF you are tolerating it in a month, we will likely increase to twice per day dosing.

## 2017-12-21 ENCOUNTER — Telehealth: Payer: Self-pay | Admitting: Neurology

## 2017-12-21 NOTE — Telephone Encounter (Signed)
-----   Message from Annamaria Helling, [place] sent at 11/18/2017  1:48 PM EDT ----- Ludwig Clarks, DO sent to Annamaria Helling, [place]    Call pt in a month to see if tolerating full pill of primidone. If so but still with tremor, increase to bid (or all 100 mg q hs)

## 2017-12-21 NOTE — Telephone Encounter (Signed)
Spoke with patient and she states she feels okay on Primidone right now. She did feel "woozie" at first, but that is gone. She states no improvement in tremor, if anything she is dropping more things and feels like tremor may be worse.  She is going out of town next week and doesn't want to increase medication until she gets back. She will try the one pill twice a day or two pills at night and let us know how she does on the medication. Will call with any problems.

## 2018-05-20 ENCOUNTER — Telehealth: Payer: Self-pay | Admitting: Neurology

## 2018-05-20 NOTE — Telephone Encounter (Signed)
Left message on machine for patient to call back.

## 2018-05-20 NOTE — Telephone Encounter (Signed)
Patient would like to speak with you regarding a Treatment she was looking into. She has some questions before she proceeds with it. She wanted to make a follow up appointment to discuss this inforamtion but June is the next available time? She also needed a refill on her Topamax. She said a prior Auth is Required. Please Call. Thanks

## 2018-05-23 ENCOUNTER — Telehealth: Payer: Self-pay | Admitting: Neurology

## 2018-05-23 MED ORDER — TOPIRAMATE 100 MG PO TABS: ORAL_TABLET | ORAL | 1 refills | Status: DC

## 2018-05-23 NOTE — Telephone Encounter (Signed)
Do I need to do something with this? Did you call the patient she wanted to speak with you?

## 2018-05-23 NOTE — Telephone Encounter (Signed)
What makes her think that it is the primidone?  When you talked with her 10/1 (a month after increase), she said she was good and no SE (although didn't think helping).  I haven't changed the dose since then so why does she think that it is this now?  As for vocal tremor, I don't think that the focused u/s is going to help that significantly, if at all.

## 2018-05-23 NOTE — Telephone Encounter (Signed)
Spoke with patient. She states she feels Primidone makes her feel "wobbly". She doesn't feel dizzy, but feels like she is grabbing the wall more often when she walks. No falls. She is taking 50 mg - 1 tablet at bedtime.   She also wanted to discuss focused ultrasound. She talked a lot about it, but it seems her major question is does it help vocal tremor? Dr. Carles Collet - please advise.

## 2018-05-23 NOTE — Telephone Encounter (Signed)
I am calling patient back - I have a previous open encounter.

## 2018-05-23 NOTE — Telephone Encounter (Signed)
Spoke with patient.  She states she has been taking Topamax every day. Has not had this filled with anyone else. Appt made for follow up with the front desk. RX sent to pharmacy.   Patient has other issues she wants to discuss and I will call her back to discuss.

## 2018-05-23 NOTE — Telephone Encounter (Signed)
Received after hours note stating,  "Caller states she ran out of topiramate she takes for essential tremor. She called yesterday and they were supposed to order. Pharmacy did not receive an order. [facility] on [address] [phone]. Took her pill today and has one pill for tomorrow. Takes once per day."   Patient was instructed to call into the clinic today. I did try to call her back Friday to discuss. Topamax last prescribed in June 2019- she would have been out for quite awhile unless she is getting this filled somewhere else. She does not have a follow up appt scheduled with Dr. Carles Collet. Awaiting call back to discuss these issues with patient. She does need an appt.

## 2018-05-23 NOTE — Telephone Encounter (Signed)
Spoke with patient and relayed information from Dr. Carles Collet. Patient made aware she did not complain of symptoms from Primidone in October so her symptoms are probably not from medication. Aware that if she wants to stop medication this is completely up to her. Advised to call PCP about new symptoms of balance change.

## 2018-05-23 NOTE — Telephone Encounter (Signed)
Needs a rx for the topamax  [facility] on [address]   Patient states that [facility] has refilled it twice for her since last June she gets 90 day supply    Please call patient she has no medication and really needs this medication

## 2018-06-01 ENCOUNTER — Other Ambulatory Visit: Payer: Self-pay | Admitting: Neurology

## 2018-06-02 ENCOUNTER — Other Ambulatory Visit: Payer: Self-pay | Admitting: Neurology

## 2018-06-02 MED ORDER — PRIMIDONE 50 MG PO TABS: ORAL_TABLET | ORAL | 0 refills | Status: DC

## 2018-06-07 ENCOUNTER — Other Ambulatory Visit: Payer: Self-pay | Admitting: Gastroenterology

## 2018-06-20 ENCOUNTER — Telehealth: Payer: Self-pay | Admitting: *Deleted

## 2018-06-20 NOTE — Telephone Encounter (Signed)
-----   Message from Isaiah Serge, NP sent at 06/20/2018 11:06 AM EDT ----- So I discussed with Dr. Meda Coffee and I will do virtual visit on the 16th.

## 2018-06-20 NOTE — Telephone Encounter (Signed)
Called pt re: apt with Cecilie Kicks, NP, 4/16.   Want to try to change to a virtual, video/phone visit. Left message for pt to call back.

## 2018-07-01 NOTE — Telephone Encounter (Signed)
I spoke to the patient who has some concerns about a virtual visit and would like to speak with Anderson Malta regarding this.  Please call on Monday morning 4/13 around 9:00 am, please.  Thank you.

## 2018-07-01 NOTE — Telephone Encounter (Signed)
Follow up    Patient is returning call. She can be reached at [phone].

## 2018-07-04 NOTE — Telephone Encounter (Signed)
Returned pts call.. Pt is aware that we will do Doximity and has been explained on how it works.     Virtual Visit Pre-Appointment Phone Call  Steps For Call:  1. Confirm consent - "In the setting of the current Covid19 crisis, you are scheduled for a (phone or video) visit with your provider on (date) at (time).  Just as we do with many in-office visits, in order for you to participate in this visit, we must obtain consent.  If you'd like, I can send this to your mychart (if signed up) or email for you to review.  Otherwise, I can obtain your verbal consent now.  All virtual visits are billed to your insurance company just like a normal visit would be.  By agreeing to a virtual visit, we'd like you to understand that the technology does not allow for your provider to perform an examination, and thus may limit your provider's ability to fully assess your condition.  Finally, though the technology is pretty good, we cannot assure that it will always work on either your or our end, and in the setting of a video visit, we may have to convert it to a phone-only visit.  In either situation, we cannot ensure that we have a secure connection.  Are you willing to proceed?"  2. Give patient instructions for WebEx download to smartphone as below if video visit  3. Advise patient to be prepared with any vital sign or heart rhythm information, their current medicines, and a piece of paper and pen handy for any instructions they may receive the day of their visit  4. Inform patient they will receive a phone call 15 minutes prior to their appointment time (may be from unknown caller ID) so they should be prepared to answer  5. Confirm that appointment type is correct in Epic appointment notes (video vs telephone)    TELEPHONE CALL NOTE  Alexis Brady has been deemed a candidate for a follow-up tele-health visit to limit community exposure during the Covid-19 pandemic. I spoke with the patient via phone  to ensure availability of phone/video source, confirm preferred email & phone number, and discuss instructions and expectations.  I reminded Alexis Brady to be prepared with any vital sign and/or heart rhythm information that could potentially be obtained via home monitoring, at the time of her visit. I reminded Alexis Brady to expect a phone call at the time of her visit if her visit.  Did the patient verbally acknowledge consent to treatment? 07/07/2018  Alexis Brady, Alexis Brady 07/04/2018 9:28 AM   DOWNLOADING THE [place], go to [employer] and type in WebEx in the search bar. [place] [employer], the blue/green circle. The app is free but as with any other app downloads, their phone may require them to verify saved payment information or Apple password. The patient does NOT have to create an account.  - If Android, ask patient to go to [employer] and type in WebEx in the search bar. [place] [employer], the blue/green circle. The app is free but as with any other app downloads, their phone may require them to verify saved payment information or Android password. The patient does NOT have to create an account.   CONSENT FOR TELE-HEALTH VISIT - PLEASE REVIEW  I hereby voluntarily request, consent and authorize [facility] and its employed or contracted physicians, Engineer, materials, nurse practitioners or other licensed health care professionals (the Practitioner),  to provide me with telemedicine health care services (the "Services") as deemed necessary by the treating Practitioner. I acknowledge and consent to receive the Services by the Practitioner via telemedicine. I understand that the telemedicine visit will involve communicating with the Practitioner through live audiovisual communication technology and the disclosure of certain medical information by electronic transmission. I acknowledge that I have been given the  opportunity to request an in-person assessment or other available alternative prior to the telemedicine visit and am voluntarily participating in the telemedicine visit.  I understand that I have the right to withhold or withdraw my consent to the use of telemedicine in the course of my care at any time, without affecting my right to future care or treatment, and that the Practitioner or I may terminate the telemedicine visit at any time. I understand that I have the right to inspect all information obtained and/or recorded in the course of the telemedicine visit and may receive copies of available information for a reasonable fee.  I understand that some of the potential risks of receiving the Services via telemedicine include:  Marland Kitchen Delay or interruption in medical evaluation due to technological equipment failure or disruption; . Information transmitted may not be sufficient (e.g. poor resolution of images) to allow for appropriate medical decision making by the Practitioner; and/or  . In rare instances, security protocols could fail, causing a breach of personal health information.  Furthermore, I acknowledge that it is my responsibility to provide information about my medical history, conditions and care that is complete and accurate to the best of my ability. I acknowledge that Practitioner's advice, recommendations, and/or decision may be based on factors not within their control, such as incomplete or inaccurate data provided by me or distortions of diagnostic images or specimens that may result from electronic transmissions. I understand that the practice of medicine is not an exact science and that Practitioner makes no warranties or guarantees regarding treatment outcomes. I acknowledge that I will receive a copy of this consent concurrently upon execution via email to the email address I last provided but may also request a printed copy by calling the office of [place] [facility].    I understand that  my insurance will be billed for this visit.   I have read or had this consent read to me. . I understand the contents of this consent, which adequately explains the benefits and risks of the Services being provided via telemedicine.  . I have been provided ample opportunity to ask questions regarding this consent and the Services and have had my questions answered to my satisfaction. . I give my informed consent for the services to be provided through the use of telemedicine in my medical care  By participating in this telemedicine visit I agree to the above.

## 2018-07-06 NOTE — Progress Notes (Signed)
Virtual Visit via Video Note   This visit type was conducted due to national recommendations for restrictions regarding the COVID-19 Pandemic (e.g. social distancing) in an effort to limit this patient's exposure and mitigate transmission in our community.  Due to her co-morbid illnesses, this patient is at least at moderate risk for complications without adequate follow up.  This format is felt to be most appropriate for this patient at this time.  All issues noted in this document were discussed and addressed.  A limited physical exam was performed with this format.  Please refer to the patient's chart for her consent to telehealth for [facility].   Evaluation Performed:  Follow-up visit  Date:  07/07/2018   ID:  Alexis Brady, DOB [birth year], MRN [id number]  Patient Location: Home Provider Location: Office  PCP:  Marton Redwood, MD  Cardiologist:  Ena Dawley, MD  Electrophysiologist:  None   Chief Complaint:  Aortic stenosis  History of Present Illness:    Alexis Brady is a 78 y.o. female with aortic stenosis was last seen 06/2016.  Last echo 06/2016 with calcified aortic valve with moderate AS (mean gradient 20 mmHg); mildly dilated ascending aorta. Repeat echo 10/2016 with EF 65-70%, moderate AS.  There was trivial regurgitation. Mean gradient (S): 23 mm Hg. Peak gradient (S): 39 mm Hg. Valve area (VTI):  1.19 cm^2. Valve area (Vmax): 1.06 cm^2. Valve area (Vmean): 1 cm^2.  No change in degree of stenosis.    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  Has had botox injections and hx of colitis.    Today she is doing well.  No chest pain or SOB.  She has no lightheadedness or dizziness.  No swelling except when in [place] and only in her [place] ankle then.   She recently had complete physical with Dr. Brigitte Pulse and everything was well.   She continues with botox injections but not sure when it will be repeated due to COVID 19.    Her husband has died  since last visit.  She is coping pretty well.  She continues with her bucket list and plans on skydiving once she has AV replacement when it is needed.  We briefly discussed TAVR today.  She has been white water rafting and zip linning.   She exercises by walking when weather is good.  She does wear mask and gloves when buying groceries and goes at 6 AM, though mostly her son is now buying her groceries.    Her goal is to live to 89.    The patient does not have symptoms concerning for COVID-19 infection (fever, chills, cough, or new shortness of breath).    Past Medical History:  Diagnosis Date  . Anemia   . Asthma    only when had bronchitis per pt  . Depression   . Eczema   . Heart murmur   . Laryngeal spasm   . Obesity   . Osteopenia   . Osteoporosis   . Restrictive lung disease   . Tremor   . Ulcerative colitis [facility])    Past Surgical History:  Procedure Laterality Date  . VOCAL CORD INJECTION       Current Meds  Medication Sig  . calcium citrate-vitamin D (CITRACAL+D) 315-200 MG-UNIT per tablet Take 1 tablet by mouth 2 (two) times daily.   . cholecalciferol (VITAMIN D) 400 UNITS TABS tablet Take 400 Units by mouth daily.  Marland Kitchen co-enzyme Q-10 30 MG capsule  Take 100 mg by mouth daily.  . mesalamine (LIALDA) 1.2 g EC tablet TAKE 2 TABLETS BY MOUTH  DAILY WITH BREAKFAST  . naproxen sodium (ALEVE) 220 MG tablet Take 220 mg by mouth 2 (two) times daily. One in the morning, one at night  . OnabotulinumtoxinA (BOTOX IM) Inject into the muscle.   . primidone (MYSOLINE) 50 MG tablet TAKE 1 TABLET(50 MG) BY MOUTH AT BEDTIME  . topiramate (TOPAMAX) 100 MG tablet TAKE 1 TABLET(100 MG) BY MOUTH DAILY  . vitamin B-12 (CYANOCOBALAMIN) 1000 MCG tablet Take 1,000 mcg by mouth daily.     Allergies:   Patient has no known allergies.   Social History   Tobacco Use  . Smoking status: Former Smoker    Last attempt to quit: 05/16/1983    Years since quitting: 35.1  . Smokeless tobacco:  Never Used  Substance Use Topics  . Alcohol use: No  . Drug use: No     Family Hx: The patient's family history includes Arrhythmia in her sister; Breast cancer in her cousin and maternal aunt; CVA in her mother; Diabetes in her sister; Fibromyalgia in her sister and sister; Heart attack in her father; Heart failure in her father; Hypertension in her sister and sister.  ROS:   Please see the history of present illness.    General:no colds or fevers, no weight changes Skin:no rashes or ulcers HEENT:no blurred vision, no congestion--voice tremulous CV:see HPI PUL:see HPI GI:no diarrhea constipation or melena, no indigestion GU:no hematuria, no dysuria MS:no joint pain, no claudication, + essential tremor  Neuro:no syncope, no lightheadedness Endo:no diabetes, no thyroid disease  All other systems reviewed and are negative.   Prior CV studies:   The following studies were reviewed today:  Echo 10/26/16  Study Conclusions  - Left ventricle: The cavity size was normal. Wall thickness was   normal. Systolic function was vigorous. The estimated ejection   fraction was in the range of 65% to 70%. Wall motion was normal;   there were no regional wall motion abnormalities. Doppler   parameters are consistent with abnormal left ventricular   relaxation (grade 1 diastolic dysfunction). The E/e&' ratio is   between 8-15, suggesting indeterminate LV filling pressure. - Aortic valve: Calcified leaflets with restricted motion. There is   moderate stenosis. There was trivial regurgitation. Mean gradient   (S): 23 mm Hg. Peak gradient (S): 39 mm Hg. Valve area (VTI):   1.19 cm^2. Valve area (Vmax): 1.06 cm^2. Valve area (Vmean): 1   cm^2. - Mitral valve: Calcified annulus. There was trivial regurgitation. - Left atrium: The atrium was normal in size. - Inferior vena cava: The vessel was normal in size. The   respirophasic diameter changes were in the normal range (>= 50%),   consistent  with normal central venous pressure.  Impressions:  - Compared to a prior study in 05/2016, the mean gradient across the   aortic valve is slightly higher, but the degree of stenosis   remains moderate.   Labs/Other Tests and Data Reviewed:    EKG:  An ECG dated 06/22/16 was personally reviewed today and demonstrated:  SR normal EKG  Recent Labs: No results found for requested labs within last 8760 hours.   Recent Lipid Panel No results found for: CHOL, TRIG, HDL, CHOLHDL, LDLCALC, LDLDIRECT  Wt Readings from Last 3 Encounters:  07/07/18 140 lb (63.5 kg)  11/18/17 140 lb (63.5 kg)  09/15/17 144 lb 4 oz (65.4 kg)  Objective:    Vital Signs:  BP (!) 152/69   Pulse 76   Ht 5' 1"  (1.549 m)   Wt 140 lb (63.5 kg)   BMI 26.45 kg/m    General:Well nourished, elderly female in no acute distress. Skin: Color is pink. HEENT  Voice is tremulous due to spastic dysphonia   Lungs:  No SOB with talking. Able to complete sentences without stopping for air.  Neuro: + essential tremor Alert and oriented X 3 Moves upper ext. Without issues  ASSESSMENT & PLAN:    1. Aortic stenosis - moderate on last echo in 2018.  She is not having any symptoms, feels well.  Will plan on echo in 6 months and follow up with Dr. Meda Coffee at that time. 2. HTN,  Elevated today, may be due to new type of visit.  She will have her son check Friday and Monday and call us the results. 3. Essential tremor on topamax and vocal cord botox injections.  4. COVID-19 Education: The signs and symptoms of COVID-19 were discussed with the patient and how to seek care for testing (follow up with PCP or arrange E-visit).  The importance of social distancing was discussed today.  Time:   Today, I have spent 24 minutes with the patient with telehealth technology discussing the above problems.     Medication Adjustments/Labs and Tests Ordered: Current medicines are reviewed at length with the patient today.  Concerns  regarding medicines are outlined above.   Tests Ordered: Orders Placed This Encounter  Procedures  . ECHOCARDIOGRAM COMPLETE    Medication Changes: No orders of the defined types were placed in this encounter.   Disposition:  Follow up in 6 month(s) for echo and with Dr. Meda Coffee.   Signed, Cecilie Kicks, NP  07/07/2018 10:18 AM    [place]

## 2018-07-07 ENCOUNTER — Encounter: Payer: Self-pay | Admitting: Cardiology

## 2018-07-07 ENCOUNTER — Telehealth (INDEPENDENT_AMBULATORY_CARE_PROVIDER_SITE_OTHER): Payer: Medicare Other | Admitting: Cardiology

## 2018-07-07 ENCOUNTER — Other Ambulatory Visit: Payer: Self-pay

## 2018-07-07 VITALS — BP 152/69 | HR 76 | Ht 61.0 in | Wt 140.0 lb

## 2018-07-07 DIAGNOSIS — G250 Essential tremor: Secondary | ICD-10-CM

## 2018-07-07 DIAGNOSIS — I10 Essential (primary) hypertension: Secondary | ICD-10-CM

## 2018-07-07 DIAGNOSIS — I350 Nonrheumatic aortic (valve) stenosis: Secondary | ICD-10-CM | POA: Diagnosis not present

## 2018-07-25 ENCOUNTER — Ambulatory Visit: Payer: Medicare Other | Admitting: Neurology

## 2018-07-28 NOTE — Progress Notes (Signed)
   Virtual Visit via Video Note (scheduled for video and converted to phone when video froze) The purpose of this virtual visit is to provide medical care while limiting exposure to the novel coronavirus.    Consent was obtained for phone visit:  Yes.   Answered questions that patient had about telephone interaction:  Yes.   I discussed the limitations, risks, security and privacy concerns of performing an evaluation and management service by telephone. I also discussed with the patient that there may be a patient responsible charge related to this service. The patient expressed understanding and agreed to proceed.  Pt location: Home Physician Location: office Name of referring provider:  Marton Redwood, MD I connected with Daron Offer at patients initiation/request on 08/01/2018 at 10:00 AM EDT by telephone application and verified that I am speaking with the correct person using two identifiers. Pt MRN:  [id number] Pt DOB:  1941/01/16 Video Participants:  Daron Offer;  Son Wille Glaser   History of Present Illness:  Patient is seen today in follow-up for tremor.  We have increased her primidone so that she is currently taking 50 mg at bedtime.  She is tolerating it well.  She states that tremor is about the same.  Still able to bake/cook/sew but does drop things.  She isn't sure if she is going to continue with vocal botox.  No falls.   Observations/Objective:   Vitals:   08/01/18 0944  Weight: 140 lb (63.5 kg)  Height: 5' 1"  (1.549 m)        Assessment and Plan:   1.  Essential Tremor.             -she previously didn't think primidone has helped but when we looked at archimedes spirals previously they were better.  We decided to increase primidone to 100 mg nightly              2.  Spasmodic dysphonia             -Under the care of ENT and receiving Botox but had to be postponed because of coronavirus and now patient not sure if wants to continue.  Follow Up Instructions:    -I discussed the assessment and treatment plan with the patient. The patient was provided an opportunity to ask questions and all were answered. The patient agreed with the plan and demonstrated an understanding of the instructions.   The patient was advised to call back or seek an in-person evaluation if the symptoms worsen or if the condition fails to improve as anticipated.    Total Time spent in visit with the patient was:  13 min, of which more than 50% of the time was spent in counseling and/or coordinating care on safety with meds and tx options.   Pt understands and agrees with the plan of care outlined.     Alonza Bogus, DO

## 2018-07-29 ENCOUNTER — Encounter: Payer: Self-pay | Admitting: *Deleted

## 2018-08-01 ENCOUNTER — Other Ambulatory Visit: Payer: Self-pay

## 2018-08-01 ENCOUNTER — Encounter: Payer: Self-pay | Admitting: Neurology

## 2018-08-01 ENCOUNTER — Telehealth (INDEPENDENT_AMBULATORY_CARE_PROVIDER_SITE_OTHER): Payer: Medicare Other | Admitting: Neurology

## 2018-08-01 DIAGNOSIS — J383 Other diseases of vocal cords: Secondary | ICD-10-CM

## 2018-08-01 DIAGNOSIS — G250 Essential tremor: Secondary | ICD-10-CM | POA: Diagnosis not present

## 2018-08-01 MED ORDER — PRIMIDONE 50 MG PO TABS: 100.0000 mg | ORAL_TABLET | Freq: Every day | ORAL | 1 refills | Status: DC

## 2018-08-24 ENCOUNTER — Ambulatory Visit: Payer: Medicare Other | Admitting: Neurology

## 2018-08-25 ENCOUNTER — Other Ambulatory Visit: Payer: Self-pay | Admitting: Gastroenterology

## 2018-09-15 ENCOUNTER — Encounter

## 2018-09-15 ENCOUNTER — Ambulatory Visit: Payer: Medicare Other | Admitting: Neurology

## 2018-10-04 ENCOUNTER — Telehealth: Payer: Self-pay | Admitting: Gastroenterology

## 2018-10-04 MED ORDER — MESALAMINE 1.2 G PO TBEC: DELAYED_RELEASE_TABLET | ORAL | 0 refills | Status: DC

## 2018-10-04 NOTE — Telephone Encounter (Signed)
Informed patient I will send her Lialda to the pharmacy but she is due for a follow-up visit with Dr. Fuller Plan. Patient verbalized understanding. Patient states she will call back and schedule.

## 2018-10-04 NOTE — Telephone Encounter (Signed)
Pt requested refill for mesalamine--she stated that she had lost the bottle.

## 2018-10-31 ENCOUNTER — Telehealth: Payer: Self-pay | Admitting: Gastroenterology

## 2018-10-31 MED ORDER — MESALAMINE 1.2 G PO TBEC: DELAYED_RELEASE_TABLET | ORAL | 1 refills | Status: DC

## 2018-10-31 NOTE — Telephone Encounter (Signed)
Informed patient I sent the prescription of Lialda to her pharmacy and to keep her appt for further refills. Patient verbalized understanding.

## 2018-11-30 ENCOUNTER — Other Ambulatory Visit: Payer: Self-pay

## 2018-11-30 MED ORDER — TOPIRAMATE 100 MG PO TABS: ORAL_TABLET | ORAL | 1 refills | Status: DC

## 2018-11-30 NOTE — Telephone Encounter (Signed)
Requested Prescriptions   Pending Prescriptions Disp Refills  . topiramate (TOPAMAX) 100 MG tablet 90 tablet 1    Sig: TAKE 1 TABLET(100 MG) BY MOUTH DAILY    Rx last filled:05/23/18 #90 1 REFILLS  Pt last seen: 08/01/18  Follow up appt scheduled:02/06/19

## 2018-12-13 ENCOUNTER — Other Ambulatory Visit: Payer: Self-pay

## 2018-12-13 ENCOUNTER — Ambulatory Visit (INDEPENDENT_AMBULATORY_CARE_PROVIDER_SITE_OTHER): Payer: Medicare Other | Admitting: Gastroenterology

## 2018-12-13 ENCOUNTER — Encounter: Payer: Self-pay | Admitting: Gastroenterology

## 2018-12-13 VITALS — BP 128/80 | HR 68 | Temp 98.2°F | Ht 61.0 in | Wt 138.0 lb

## 2018-12-13 DIAGNOSIS — K5130 Ulcerative (chronic) rectosigmoiditis without complications: Secondary | ICD-10-CM

## 2018-12-13 MED ORDER — NA SULFATE-K SULFATE-MG SULF 17.5-3.13-1.6 GM/177ML PO SOLN: 1.0000 | Freq: Once | ORAL | 0 refills | Status: AC

## 2018-12-13 MED ORDER — MESALAMINE 1.2 G PO TBEC: DELAYED_RELEASE_TABLET | ORAL | 11 refills | Status: DC

## 2018-12-13 NOTE — Patient Instructions (Signed)
We have sent the following medications to your pharmacy for you to pick up at your convenience: [place].   You have been scheduled for a colonoscopy. Please follow written instructions given to you at your visit today.  Please pick up your prep supplies at the pharmacy within the next 1-3 days. If you use inhalers (even only as needed), please bring them with you on the day of your procedure. Your physician has requested that you go to www.startemmi.com and enter the access code given to you at your visit today. This web site gives a general overview about your procedure. However, you should still follow specific instructions given to you by our office regarding your preparation for the procedure.  Thank you for choosing me and [facility].  Pricilla Riffle. Dagoberto Ligas., MD., Marval Regal

## 2018-12-13 NOTE — Progress Notes (Signed)
    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  Last office visit in Feb 2019.  She relates no problems with any GI symptoms.  She relates regular compliance with Lialda 2.4 g daily. She states that she had blood work in Dr. Raul Del office around March, April or May of this year. Denies weight loss, abdominal pain, constipation, diarrhea, change in stool caliber, melena, hematochezia, nausea, vomiting, dysphagia, reflux symptoms, chest pain.  Current Medications, Allergies, Past Medical History, Past Surgical History, Family History and Social History were reviewed in [employer] record.   Physical Exam: General: Well developed, well nourished, no acute distress Head: Normocephalic and atraumatic Eyes:  sclerae anicteric, EOMI Ears: Normal auditory acuity Mouth: No deformity or lesions, tremulous voice Lungs: Clear throughout to auscultation Heart: Regular rate and rhythm; systolic murmurs, rubs or bruits Abdomen: Soft, non tender and non distended. No masses, hepatosplenomegaly or hernias noted. Normal Bowel sounds Rectal: Not done Musculoskeletal: Symmetrical with no gross deformities  Pulses:  Normal pulses noted Extremities: No clubbing, cyanosis, edema or deformities noted Neurological: Alert oriented x 4, tremor Psychological:  Alert and cooperative. Normal mood and affect   Assessment and Recommendations:  1. Ulcerative proctosigmoiditis diagnosed in 2017. Continue Lialda 2.4g daily. Request blood work from Dr. Raul Del office. Schedule colonoscopy to assess disease activity, control. The risks (including bleeding, perforation, infection, missed lesions, medication reactions and possible hospitalization or surgery if complications occur), benefits, and alternatives to colonoscopy with possible biopsy and possible polypectomy were discussed with the patient and they consent to proceed.    2.   Moderate AS, stable, followed by Dr. Meda Coffee.  3.  Essential tremor, spastic dysphonia.

## 2018-12-17 ENCOUNTER — Encounter: Payer: Medicare Other | Admitting: Gastroenterology

## 2019-01-02 ENCOUNTER — Telehealth: Payer: Self-pay | Admitting: Neurology

## 2019-01-02 ENCOUNTER — Telehealth: Payer: Self-pay

## 2019-01-02 NOTE — Telephone Encounter (Signed)
Refill on the topiramate 131m to the pharm on file for 90 day supply. Thanks!

## 2019-01-02 NOTE — Telephone Encounter (Signed)
Patient is also needing the Primidone 50 mg - 90 days supply.

## 2019-01-02 NOTE — Telephone Encounter (Signed)
Covid-19 screening questions   Do you now or have you had a fever in the last 14 days? NO   Do you have any respiratory symptoms of shortness of breath or cough now or in the last 14 days? NO   Do you have any family members or close contacts with diagnosed or suspected Covid-19 in the past 14 days? NO   Have you been tested for Covid-19 and found to be positive? NO

## 2019-01-02 NOTE — Telephone Encounter (Signed)
Requested Prescriptions   Pending Prescriptions Disp Refills  . topiramate (TOPAMAX) 100 MG tablet 90 tablet 1    Sig: TAKE 1 TABLET(100 MG) BY MOUTH DAILY   Rx last filled: 11/30/18 #90 1 refills  Pt last seen: 08/01/18   Follow up appt scheduled: 02/06/19  Denied request to soon    Rx was sent to  Original Order:  topiramate (TOPAMAX) 100 MG tablet [[id number]]    Pharmacy:  [facility] [place], [address] AT [place] #:  [id number]   Pt should contact pharmacy for refills

## 2019-01-03 ENCOUNTER — Telehealth: Payer: Self-pay | Admitting: Neurology

## 2019-01-03 ENCOUNTER — Encounter: Payer: Self-pay | Admitting: Gastroenterology

## 2019-01-03 ENCOUNTER — Ambulatory Visit (AMBULATORY_SURGERY_CENTER): Payer: Medicare Other | Admitting: Gastroenterology

## 2019-01-03 ENCOUNTER — Other Ambulatory Visit: Payer: Self-pay

## 2019-01-03 VITALS — BP 124/54 | HR 71 | Temp 97.8°F | Resp 12 | Ht 61.0 in | Wt 138.0 lb

## 2019-01-03 DIAGNOSIS — K51319 Ulcerative (chronic) rectosigmoiditis with unspecified complications: Secondary | ICD-10-CM

## 2019-01-03 MED ORDER — PRIMIDONE 50 MG PO TABS: 100.0000 mg | ORAL_TABLET | Freq: Every day | ORAL | 1 refills | Status: DC

## 2019-01-03 MED ORDER — SODIUM CHLORIDE 0.9 % IV SOLN: 500.0000 mL | Freq: Once | INTRAVENOUS | Status: DC

## 2019-01-03 NOTE — Telephone Encounter (Signed)
Rx for piminodine sent to pharmacy

## 2019-01-03 NOTE — Telephone Encounter (Signed)
There was another msg left with after hours about patient being out of her topamax 135m qd and primidone 100 mg take 2 at HS.

## 2019-01-03 NOTE — Progress Notes (Signed)
To PACU, VSS. Report to [email or web address] 

## 2019-01-03 NOTE — Progress Notes (Signed)
Called to room to assist during endoscopic procedure.  Patient ID and intended procedure confirmed with present staff. Received instructions for my participation in the procedure from the performing physician.  

## 2019-01-03 NOTE — Patient Instructions (Signed)
   Await biopsy results  Information on diverticulosis given to you today   YOU HAD AN ENDOSCOPIC PROCEDURE TODAY AT [place]:   Refer to the procedure report that was given to you for any specific questions about what was found during the examination.  If the procedure report does not answer your questions, please call your gastroenterologist to clarify.  If you requested that your care partner not be given the details of your procedure findings, then the procedure report has been included in a sealed envelope for you to review at your convenience later.  YOU SHOULD EXPECT: Some feelings of bloating in the abdomen. Passage of more gas than usual.  Walking can help get rid of the air that was put into your GI tract during the procedure and reduce the bloating. If you had a lower endoscopy (such as a colonoscopy or flexible sigmoidoscopy) you may notice spotting of blood in your stool or on the toilet paper. If you underwent a bowel prep for your procedure, you may not have a normal bowel movement for a few days.  Please Note:  You might notice some irritation and congestion in your nose or some drainage.  This is from the oxygen used during your procedure.  There is no need for concern and it should clear up in a day or so.  SYMPTOMS TO REPORT IMMEDIATELY:   Following lower endoscopy (colonoscopy or flexible sigmoidoscopy):  Excessive amounts of blood in the stool  Significant tenderness or worsening of abdominal pains  Swelling of the abdomen that is new, acute  Fever of 100F or higher    For urgent or emergent issues, a gastroenterologist can be reached at any hour by calling [phone].   DIET:  We do recommend a small meal at first, but then you may proceed to your regular diet.  Drink plenty of fluids but you should avoid alcoholic beverages for 24 hours.  ACTIVITY:  You should plan to take it easy for the rest of today and you should NOT DRIVE or use heavy  machinery until tomorrow (because of the sedation medicines used during the test).    FOLLOW UP: Our staff will call the number listed on your records 48-72 hours following your procedure to check on you and address any questions or concerns that you may have regarding the information given to you following your procedure. If we do not reach you, we will leave a message.  We will attempt to reach you two times.  During this call, we will ask if you have developed any symptoms of COVID 19. If you develop any symptoms (ie: fever, flu-like symptoms, shortness of breath, cough etc.) before then, please call [phone].  If you test positive for Covid 19 in the 2 weeks post procedure, please call and report this information to [place].    If any biopsies were taken you will be contacted by phone or by letter within the next 1-3 weeks.  Please call us at [phone] if you have not heard about the biopsies in 3 weeks.    SIGNATURES/CONFIDENTIALITY: You and/or your care partner have signed paperwork which will be entered into your electronic medical record.  These signatures attest to the fact that that the information above on your After Visit Summary has been reviewed and is understood.  Full responsibility of the confidentiality of this discharge information lies with you and/or your care-partner.

## 2019-01-03 NOTE — Progress Notes (Signed)
Temperature- [place]

## 2019-01-03 NOTE — Telephone Encounter (Signed)
Requested Prescriptions   Pending Prescriptions Disp Refills  . primidone (MYSOLINE) 50 MG tablet 180 tablet 1    Sig: Take 2 tablets (100 mg total) by mouth at bedtime.   Rx last filled: 08/01/18 #180 1 refills  Pt last seen: 08/01/18   Follow up appt scheduled:02/06/19

## 2019-01-03 NOTE — Telephone Encounter (Signed)
Called spoke with patient she was made aware that both rx was sent to pharmacy. And that Topamax has refills at pharmacy. Pt states that she called pharmacy to look refills up with her phone number and could not find refills listed under her phone number. Once she spoke with pharmacy she was made aware that she had refills.    Pt states that she called after hours regarding needing refills on another Rx primidone. Pt notified that Rx was sent today. Pt upset that she had to wait for refills after calling office for request and not hearing back from office.  I apologies to patient for delay in return call.

## 2019-01-03 NOTE — Telephone Encounter (Signed)
See yesterday telephone encounter patient should have refills at pharmacy. Will make her aware of this. She should call her pharmacy for refills.

## 2019-01-03 NOTE — Telephone Encounter (Signed)
Patient left msg with after hours having a medication question but the only info given was pharm is [facility] on [address]

## 2019-01-03 NOTE — Op Note (Signed)
[place] Patient Name: Ambert Virrueta Procedure Date: 01/03/2019 1:24 PM MRN: [id number] Endoscopist: Ladene Artist , MD Age: 78 Referring MD:  Date of Birth: April 29, 1940 Gender: Female Account #: [id number] Procedure:                Colonoscopy Indications:              Determine extent and severity of inflammatory bowel                            disease Medicines:                Monitored Anesthesia Care Procedure:                Pre-Anesthesia Assessment:                           - Prior to the procedure, a History and Physical                            was performed, and patient medications and                            allergies were reviewed. The patient's tolerance of                            previous anesthesia was also reviewed. The risks                            and benefits of the procedure and the sedation                            options and risks were discussed with the patient.                            All questions were answered, and informed consent                            was obtained. Prior Anticoagulants: The patient has                            taken no previous anticoagulant or antiplatelet                            agents. ASA Grade Assessment: II - A patient with                            mild systemic disease. After reviewing the risks                            and benefits, the patient was deemed in                            satisfactory condition to undergo the procedure.  After obtaining informed consent, the colonoscope                            was passed under direct vision. Throughout the                            procedure, the patient's blood pressure, pulse, and                            oxygen saturations were monitored continuously. The                            Colonoscope was introduced through the anus and                            advanced to the the cecum, identified by                          appendiceal orifice and ileocecal valve. The                            ileocecal valve, appendiceal orifice, and rectum                            were photographed. The quality of the bowel                            preparation was excellent. The colonoscopy was                            performed without difficulty. The patient tolerated                            the procedure well. Scope In: 1:41:43 PM Scope Out: 1:52:31 PM Scope Withdrawal Time: 0 hours 9 minutes 24 seconds  Total Procedure Duration: 0 hours 10 minutes 48 seconds  Findings:                 The perianal and digital rectal examinations were                            normal.                           Multiple medium-mouthed diverticula were found in                            the left colon. There was no evidence of                            diverticular bleeding.                           The exam was otherwise without abnormality on  direct and retroflexion views. Random biopsies                            obtained from right colon and left colon. Complications:            No immediate complications. Estimated blood loss:                            None. Estimated Blood Loss:     Estimated blood loss: none. Impression:               - Moderate diverticulosis in the left colon.                           - The examination was otherwise normal on direct                            and retroflexion views. Biopsied. Recommendation:           - Repeat colonoscopy date to be determined after                            pending pathology results are reviewed for                            surveillance.                           - Patient has a contact number available for                            emergencies. The signs and symptoms of potential                            delayed complications were discussed with the                            patient. Return to normal  activities tomorrow.                            Written discharge instructions were provided to the                            patient.                           - Resume previous diet.                           - Continue present medications.                           - Await pathology results. Ladene Artist, MD 01/03/2019 1:56:21 PM This report has been signed electronically.

## 2019-01-05 ENCOUNTER — Telehealth: Payer: Self-pay

## 2019-01-05 NOTE — Telephone Encounter (Signed)
  Follow up Call-  Call back number 01/03/2019  Post procedure Call Back phone  # [phone]  Permission to leave phone message Yes  Some recent data might be hidden     Patient questions:  Do you have a fever, pain , or abdominal swelling? No. Pain Score  0 *  Have you tolerated food without any problems? Yes.    Have you been able to return to your normal activities? Yes.    Do you have any questions about your discharge instructions: Diet   No. Medications  No. Follow up visit  No.  Do you have questions or concerns about your Care? No.  Actions: * If pain score is 4 or above: No action needed, pain <4. 1. Have you developed a fever since your procedure? no  2.   Have you had an respiratory symptoms (SOB or cough) since your procedure? no  3.   Have you tested positive for COVID 19 since your procedure no  4.   Have you had any family members/close contacts diagnosed with the COVID 19 since your procedure?  no   If yes to any of these questions please route to Joylene John, RN and Alphonsa Gin, Therapist, sports.

## 2019-01-06 ENCOUNTER — Encounter: Payer: Self-pay | Admitting: Gastroenterology

## 2019-02-03 NOTE — Progress Notes (Signed)
Alexis Brady was seen today in follow up for Essential tremor.  Pt is currently on primidone, 100 mg nightly (increased last visit). she is also on topamax since 02/2015.   Pt denies falls.  Pt denies lightheadedness, near syncope.  No hallucinations.  Mood has been fair.  She is really frustrated because of UTI.  She just got off of abx and then got better and the day she got off of abx she got worse again.  She called and was put on a different abx and she is leaving here and going to her PCP office.  She is having significant flank pain     ALLERGIES:  No Known Allergies  CURRENT MEDICATIONS:  Outpatient Encounter Medications as of 02/06/2019  Medication Sig  . calcium citrate-vitamin D (CITRACAL+D) 315-200 MG-UNIT per tablet Take 1 tablet by mouth 2 (two) times daily.   . cholecalciferol (VITAMIN D) 400 UNITS TABS tablet Take 400 Units by mouth daily.  Marland Kitchen co-enzyme Q-10 30 MG capsule Take 100 mg by mouth daily.  . mesalamine (LIALDA) 1.2 g EC tablet TAKE 2 TABLETS BY MOUTH  DAILY WITH BREAKFAST  . naproxen sodium (ALEVE) 220 MG tablet Take 220 mg by mouth 2 (two) times daily. One in the morning, one at night  . primidone (MYSOLINE) 50 MG tablet Take 2 tablets (100 mg total) by mouth at bedtime.  . topiramate (TOPAMAX) 100 MG tablet TAKE 1 TABLET(100 MG) BY MOUTH DAILY  . vitamin B-12 (CYANOCOBALAMIN) 1000 MCG tablet Take 1,000 mcg by mouth daily.   No facility-administered encounter medications on file as of 02/06/2019.     PAST MEDICAL HISTORY:   Past Medical History:  Diagnosis Date  . Anemia   . Asthma    only when had bronchitis per pt  . Depression   . Eczema   . Heart murmur   . Laryngeal spasm   . Obesity   . Osteopenia   . Osteoporosis   . Restrictive lung disease   . Tremor   . Ulcerative colitis ([place])     PAST SURGICAL HISTORY:   Past Surgical History:  Procedure Laterality Date  . COLONOSCOPY    . VOCAL CORD INJECTION      SOCIAL HISTORY:    Social History   Socioeconomic History  . Marital status: Married    Spouse name: Not on file  . Number of children: 2  . Years of education: two years college  . Highest education level: Not on file  Occupational History  . Occupation: Retired  Scientific laboratory technician  . Financial resource strain: Not on file  . Food insecurity    Worry: Not on file    Inability: Not on file  . Transportation needs    Medical: Not on file    Non-medical: Not on file  Tobacco Use  . Smoking status: Former Smoker    Quit date: 05/16/1983    Years since quitting: 35.7  . Smokeless tobacco: Never Used  Substance and Sexual Activity  . Alcohol use: No  . Drug use: No  . Sexual activity: Not on file  Lifestyle  . Physical activity    Days per week: Not on file    Minutes per session: Not on file  . Stress: Not on file  Relationships  . Social Herbalist on phone: Not on file    Gets together: Not on file    Attends religious service: Not on file    Active  member of club or organization: Not on file    Attends meetings of clubs or organizations: Not on file    Relationship status: Not on file  . Intimate partner violence    Fear of current or ex partner: Not on file    Emotionally abused: Not on file    Physically abused: Not on file    Forced sexual activity: Not on file  Other Topics Concern  . Not on file  Social History Narrative   Lives at home alone.   No caffeine use.   Right-handed.    FAMILY HISTORY:   Family Status  Relation Name Status  . Mother  Deceased at age 49       CVA  . Sister  Alive       Diabetes, HTN, Fibromyalgia  . Son  Alive       healthy  . Son  Deceased at age 51       MVA  . Father  Deceased at age 34       CHF, MI  . Mat Aunt  Deceased  . Cousin  Alive  . Sister  Alive  . Neg Hx  (Not Specified)    ROS:  Review of Systems  Eyes: Negative.   Respiratory: Negative.   Cardiovascular: Negative.   Gastrointestinal: Negative.    Genitourinary: Positive for dysuria.  Musculoskeletal: Positive for back pain (R flank).  Skin: Negative.     PHYSICAL EXAMINATION:    VITALS:   Vitals:   02/06/19 0838  BP: 130/88  Resp: 16  Temp: 97.8 F (36.6 C)  Weight: 135 lb 4.8 oz (61.4 kg)  Height: 5' 1"  (1.549 m)    GEN:  The patient appears stated age and is in NAD.  She is tearful. HEENT:  Normocephalic, atraumatic.  The mucous membranes are moist. The superficial temporal arteries are without ropiness or tenderness. CV:  RRR Lungs:  CTAB Neck/HEME:  There are no carotid bruits bilaterally.  Neurological examination:  Orientation: The patient is alert and oriented x3. Cranial nerves: There is good facial symmetry without facial hypomimia. The speech is fluent and with significant spasmodic dysphonia. Soft palate rises symmetrically and there is no tongue deviation. Hearing is intact to conversational tone. Sensation: Sensation is intact to light touch throughout Motor: Strength is at least antigravity x4.  Movement examination: Tone: There is normal tone in the UE/LE Abnormal movements: no significant intention tremor.  Does have head tremor.  Pours water from 1 glass to another without spilling it. Coordination:  There is no decremation with RAM's. Gait and Station: The patient has no difficulty arising out of a deep-seated chair without the use of the hands. The patient's stride length is good.     ASSESSMENT/PLAN:  1.  Essential tremor  -Continue primidone, 100 mg nightly  -discussed that we will continue the Topamax, 200 mg daily for now.  However, we may need to change this depending on whether or not she has kidney stones, versus just a bladder infection.  She is headed to her primary care physician office now. 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  Has received Botox. 3.  UTI vs nephrolithiasis  -The patient to let me know if she actually has a kidney stone.  Discussed with patient that  Topamax increases risk for these.  If she has a kidney stone, we would want to notify, as we would need to figure out if we need to take her  off of the Topamax.  Patient expressed understanding.  I did give her a empty urine specimen cup so that she could deliver a urine specimen to her primary care physician, at her request. 4.  Follow up is anticipated in the next 4-6 months, sooner should new neurologic issues arise.  Much greater than 50% of this visit was spent in counseling and coordinating care.  Total face to face time:  25 min  Cc:  Marton Redwood, MD

## 2019-02-06 ENCOUNTER — Other Ambulatory Visit: Payer: Self-pay | Admitting: Internal Medicine

## 2019-02-06 ENCOUNTER — Other Ambulatory Visit: Payer: Self-pay

## 2019-02-06 ENCOUNTER — Encounter: Payer: Self-pay | Admitting: Neurology

## 2019-02-06 ENCOUNTER — Ambulatory Visit
Admission: RE | Admit: 2019-02-06 | Discharge: 2019-02-06 | Disposition: A | Discharge status: Home / Self Care | Payer: Medicare Other | Source: Ambulatory Visit | Attending: Internal Medicine | Admitting: Internal Medicine

## 2019-02-06 ENCOUNTER — Ambulatory Visit: Payer: Medicare Other | Admitting: Neurology

## 2019-02-06 VITALS — BP 130/88 | Temp 97.8°F | Resp 16 | Ht 61.0 in | Wt 135.3 lb

## 2019-02-06 DIAGNOSIS — G250 Essential tremor: Secondary | ICD-10-CM

## 2019-02-06 DIAGNOSIS — R109 Unspecified abdominal pain: Secondary | ICD-10-CM

## 2019-02-06 DIAGNOSIS — R300 Dysuria: Secondary | ICD-10-CM | POA: Diagnosis not present

## 2019-02-06 NOTE — Patient Instructions (Signed)
Please let me know if you have a kidney stone (not just a bladder infection) as topamax increases risk for them and we may need to adjust this.

## 2019-03-02 ENCOUNTER — Other Ambulatory Visit: Payer: Self-pay | Admitting: Internal Medicine

## 2019-03-02 DIAGNOSIS — Z1231 Encounter for screening mammogram for malignant neoplasm of breast: Secondary | ICD-10-CM

## 2019-03-21 ENCOUNTER — Encounter: Payer: Self-pay | Admitting: *Deleted

## 2019-03-22 ENCOUNTER — Other Ambulatory Visit (HOSPITAL_COMMUNITY): Payer: Medicare Other

## 2019-03-23 ENCOUNTER — Ambulatory Visit (HOSPITAL_COMMUNITY): Payer: Medicare Other | Attending: Cardiovascular Disease

## 2019-03-23 ENCOUNTER — Other Ambulatory Visit: Payer: Self-pay

## 2019-03-23 DIAGNOSIS — I350 Nonrheumatic aortic (valve) stenosis: Secondary | ICD-10-CM | POA: Insufficient documentation

## 2019-03-30 ENCOUNTER — Ambulatory Visit: Payer: Medicare Other | Admitting: Cardiology

## 2019-04-03 ENCOUNTER — Other Ambulatory Visit: Payer: Self-pay

## 2019-04-03 MED ORDER — PRIMIDONE 50 MG PO TABS: 100.0000 mg | ORAL_TABLET | Freq: Every day | ORAL | 1 refills | Status: DC

## 2019-04-12 ENCOUNTER — Ambulatory Visit: Payer: Medicare Other | Attending: Internal Medicine

## 2019-04-12 DIAGNOSIS — Z23 Encounter for immunization: Secondary | ICD-10-CM | POA: Insufficient documentation

## 2019-04-12 NOTE — Progress Notes (Signed)
   Covid-19 Vaccination Clinic  Name:  Alexis Brady    MRN: [id number] DOB: Jul 30, 1940  04/12/2019  Ms. Graffeo was observed post Covid-19 immunization for 30 minutes based on pre-vaccination screening without incidence. She was provided with Vaccine Information Sheet and instruction to access the V-Safe system.   Ms. Stannard was instructed to call 911 with any severe reactions post vaccine: Marland Kitchen Difficulty breathing  . Swelling of your face and throat  . A fast heartbeat  . A bad rash all over your body  . Dizziness and weakness    Immunizations Administered    Name Date Dose VIS Date Route   Pfizer COVID-19 Vaccine 04/12/2019  2:20 PM 0.3 mL 03/03/2019 Intramuscular   Manufacturer: [place]   Lot: [id number]   [place]: [id number]

## 2019-04-21 ENCOUNTER — Ambulatory Visit
Admission: RE | Admit: 2019-04-21 | Discharge: 2019-04-21 | Disposition: A | Discharge status: Home / Self Care | Payer: Medicare Other | Source: Ambulatory Visit | Attending: Internal Medicine | Admitting: Internal Medicine

## 2019-04-21 ENCOUNTER — Other Ambulatory Visit: Payer: Self-pay

## 2019-04-21 DIAGNOSIS — Z1231 Encounter for screening mammogram for malignant neoplasm of breast: Secondary | ICD-10-CM

## 2019-05-01 ENCOUNTER — Ambulatory Visit: Payer: Medicare Other | Attending: Internal Medicine

## 2019-05-01 DIAGNOSIS — Z23 Encounter for immunization: Secondary | ICD-10-CM | POA: Insufficient documentation

## 2019-05-01 NOTE — Progress Notes (Signed)
   Covid-19 Vaccination Clinic  Name:  Alexis Brady    MRN: [id number] DOB: 1941-02-27  05/01/2019  Ms. Alexis Brady was observed post Covid-19 immunization for 15 minutes without incidence. She was provided with Vaccine Information Sheet and instruction to access the V-Safe system.   Ms. Alexis Brady was instructed to call 911 with any severe reactions post vaccine: Marland Kitchen Difficulty breathing  . Swelling of your face and throat  . A fast heartbeat  . A bad rash all over your body  . Dizziness and weakness    Immunizations Administered    Name Date Dose VIS Date Route   Pfizer COVID-19 Vaccine 05/01/2019  9:21 AM 0.3 mL 03/03/2019 Intramuscular   Manufacturer: [place]   Lot: [id number]   [place]: [address]-5

## 2019-07-17 ENCOUNTER — Telehealth: Payer: Self-pay

## 2019-07-17 MED ORDER — TOPIRAMATE 100 MG PO TABS: ORAL_TABLET | ORAL | 1 refills | Status: DC

## 2019-07-17 NOTE — Telephone Encounter (Signed)
Rx(s) sent to pharmacy electronically.  

## 2019-07-20 ENCOUNTER — Other Ambulatory Visit (HOSPITAL_COMMUNITY): Payer: Self-pay

## 2019-07-21 ENCOUNTER — Other Ambulatory Visit: Payer: Self-pay

## 2019-07-21 ENCOUNTER — Ambulatory Visit (HOSPITAL_COMMUNITY)
Admission: RE | Admit: 2019-07-21 | Discharge: 2019-07-21 | Disposition: A | Discharge status: Home / Self Care | Payer: Medicare Other | Source: Ambulatory Visit | Attending: Internal Medicine | Admitting: Internal Medicine

## 2019-07-21 DIAGNOSIS — M810 Age-related osteoporosis without current pathological fracture: Secondary | ICD-10-CM | POA: Insufficient documentation

## 2019-07-21 MED ORDER — ZOLEDRONIC ACID 5 MG/100ML IV SOLN: 5.0000 mg | Freq: Once | INTRAVENOUS | Status: AC

## 2019-07-21 MED ORDER — ZOLEDRONIC ACID 5 MG/100ML IV SOLN
INTRAVENOUS | Status: AC
  Administered 2019-07-21: 5 mg via INTRAVENOUS
  Filled 2019-07-21: qty 100

## 2019-08-15 NOTE — Progress Notes (Signed)
Assessment/Plan:    1.  Essential Tremor  -Increase primidone, 50 mg , 3 tablets at bedtime  -Continue topiramate, 200 mg daily 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  Has received Botox one time years ago but didn't think worth going through that process. 3.  F/u in 8 months, sooner should new neuro issues arise.    Subjective:   Alexis Brady was seen today in follow up for essential tremor.  My previous records were reviewed prior to todays visit.  Patient was supposed to call me back after last visit regarding whether or not she actually had a kidney stone, as this would have changed the treatment with topiramate.  I did not hear back from her.  She reports today that she isn't sure (never confirmed) but she wonders if she wonders if she passed it. Hand tremor has been "stable bad," but maybe a little worse.  Wonders if head bobbing is not a little worse.  pt denies falls.  Pt denies lightheadedness, near syncope.  No hallucinations.  Mood has been good.  Current prescribed movement disorder medications: Primidone, 100 mg nightly Topiramate, 200 mg daily  ALLERGIES:  No Known Allergies  CURRENT MEDICATIONS:  Outpatient Encounter Medications as of 08/17/2019  Medication Sig  . calcium citrate-vitamin D (CITRACAL+D) 315-200 MG-UNIT per tablet Take 1 tablet by mouth 2 (two) times daily.   . cholecalciferol (VITAMIN D) 400 UNITS TABS tablet Take 400 Units by mouth daily.  Marland Kitchen co-enzyme Q-10 30 MG capsule Take 100 mg by mouth daily.  . mesalamine (LIALDA) 1.2 g EC tablet TAKE 2 TABLETS BY MOUTH  DAILY WITH BREAKFAST  . naproxen sodium (ALEVE) 220 MG tablet Take 220 mg by mouth 2 (two) times daily. One in the morning, one at night  . primidone (MYSOLINE) 50 MG tablet Take 2 tablets (100 mg total) by mouth at bedtime.  . topiramate (TOPAMAX) 100 MG tablet TAKE 1 TABLET(100 MG) BY MOUTH DAILY  . vitamin B-12 (CYANOCOBALAMIN) 1000 MCG tablet Take 1,000 mcg by mouth daily.    . [DISCONTINUED] ciprofloxacin (CIPRO) 500 MG tablet Take 500 mg by mouth 2 (two) times daily.   No facility-administered encounter medications on file as of 08/17/2019.     Objective:    PHYSICAL EXAMINATION:    VITALS:   Vitals:   08/17/19 0814  BP: 119/68  Pulse: 64  SpO2: 99%  Weight: 134 lb (60.8 kg)  Height: 5' 1"  (1.549 m)    GEN:  The patient appears stated age and is in NAD. HEENT:  Normocephalic, atraumatic.  The mucous membranes are moist. The superficial temporal arteries are without ropiness or tenderness. CV:  RRR Lungs:  CTAB Neck/HEME:  There are no carotid bruits bilaterally.  Neurological examination:  Orientation: The patient is alert and oriented x3. Cranial nerves: There is good facial symmetry. The speech is fluent and with significant spasmodic dysphonia. Soft palate rises symmetrically and there is no tongue deviation. Hearing is intact to conversational tone. Sensation: Sensation is intact to light touch throughout Motor: Strength is at least antigravity x4.  Movement examination: Tone: There is normal tone in the UE/LE Abnormal movements: no rest tremor.  She has no postural tremor.  She does have significant tremor with given a weight on the L and mild with the weight on the R.  She has some intention tremor with finger to nose on the L.  She has mild head tremor in the "yes" direction Coordination:  There is no decremation with RAM's Gait and Station: The patient has no difficulty arising out of a deep-seated chair without the use of the hands. The patient's stride length is good.  She was able to do tandem gait well I have reviewed and interpreted the following labs independently   Chemistry      Component Value Date/Time   NA 140 03/19/2015 1132   K 4.3 03/19/2015 1132   CL 110 03/19/2015 1132   CO2 23 03/19/2015 1132   BUN 23 (H) 03/19/2015 1132   CREATININE 0.86 03/19/2015 1132      Component Value Date/Time   CALCIUM 9.1 03/19/2015  1132      Lab Results  Component Value Date   WBC 8.0 03/19/2015   HGB 12.5 03/19/2015   HCT 39.5 03/19/2015   MCV 62.2 (L) 03/19/2015   PLT 182 03/19/2015   No results found for: TSH   Chemistry      Component Value Date/Time   NA 140 03/19/2015 1132   K 4.3 03/19/2015 1132   CL 110 03/19/2015 1132   CO2 23 03/19/2015 1132   BUN 23 (H) 03/19/2015 1132   CREATININE 0.86 03/19/2015 1132      Component Value Date/Time   CALCIUM 9.1 03/19/2015 1132         Total time spent on today's visit was 30 minutes, including both face-to-face time and nonface-to-face time.  Time included that spent on review of records (prior notes available to me/labs/imaging if pertinent), discussing treatment and goals, answering patient's questions and coordinating care.  Cc:  Marton Redwood, MD

## 2019-08-17 ENCOUNTER — Ambulatory Visit: Payer: Medicare Other | Admitting: Neurology

## 2019-08-17 ENCOUNTER — Encounter: Payer: Self-pay | Admitting: Neurology

## 2019-08-17 ENCOUNTER — Other Ambulatory Visit: Payer: Self-pay

## 2019-08-17 VITALS — BP 119/68 | HR 64 | Ht 61.0 in | Wt 134.0 lb

## 2019-08-17 DIAGNOSIS — J383 Other diseases of vocal cords: Secondary | ICD-10-CM | POA: Diagnosis not present

## 2019-08-17 DIAGNOSIS — G250 Essential tremor: Secondary | ICD-10-CM

## 2019-08-17 MED ORDER — PRIMIDONE 50 MG PO TABS: 150.0000 mg | ORAL_TABLET | Freq: Every day | ORAL | 1 refills | Status: DC

## 2019-08-17 NOTE — Patient Instructions (Signed)
1.  Increase primidone, 50 mg, 3 tablets at bedtime 2.  Continue topiramate, 200 mg daily 3.  Try to get a weighted fingerless glove, 0.5 to 1 lb glove, that you buy on [facility]

## 2020-01-28 ENCOUNTER — Other Ambulatory Visit: Payer: Self-pay | Admitting: Gastroenterology

## 2020-02-27 ENCOUNTER — Other Ambulatory Visit: Payer: Self-pay | Admitting: Gastroenterology

## 2020-03-18 ENCOUNTER — Telehealth: Payer: Self-pay | Admitting: Neurology

## 2020-03-18 NOTE — Telephone Encounter (Signed)
Left message that refill request to contact pharmacy, unsure of medication.

## 2020-03-18 NOTE — Telephone Encounter (Signed)
Patient left message with the after our service on 03-18-20  Caller states that she is out of medication she did not leave the name of the medication

## 2020-03-20 ENCOUNTER — Other Ambulatory Visit: Payer: Self-pay

## 2020-03-20 MED ORDER — TOPIRAMATE 100 MG PO TABS: ORAL_TABLET | ORAL | 0 refills | Status: DC

## 2020-03-20 NOTE — Telephone Encounter (Signed)
Patient called in needing a refill on her Topiramate to be sent to [facility]. She has been without it for a week now.

## 2020-03-20 NOTE — Telephone Encounter (Signed)
Refill for one month until next appointment sent in for pt

## 2020-03-28 ENCOUNTER — Other Ambulatory Visit: Payer: Self-pay | Admitting: Gastroenterology

## 2020-04-09 ENCOUNTER — Telehealth: Payer: Self-pay | Admitting: Gastroenterology

## 2020-04-09 MED ORDER — MESALAMINE 1.2 G PO TBEC: DELAYED_RELEASE_TABLET | ORAL | 1 refills | Status: DC

## 2020-04-09 NOTE — Telephone Encounter (Signed)
Informed patient that if she does not already have a my chart log in and a computer at home with video then she cannot have a virtual visit. Patient states she has a computer with a camera at home and also her son is going to sign her up for my chart tomorrow. Informed patient that she has to have this prior to her visit and also that her son should be with her to help the day of her vitual appt. Patient states she will have her son help her with both. Patient requested a early appt. Patient scheduled for 05/20/20 at 8:30am. Prescription sent to patient's pharmacy until scheduled virtual visit.

## 2020-04-09 NOTE — Telephone Encounter (Signed)
If she is fully equipped to perform a virtual visit then ok.

## 2020-04-09 NOTE — Telephone Encounter (Signed)
Pt needs ov for further rfs so she is requesting if visit can be virtual. She states that she is out of mesalamine and is requesting more rfs. Pls call her.

## 2020-04-09 NOTE — Telephone Encounter (Signed)
Dr. Fuller Plan,  Can patient be scheduled for virtual appt?

## 2020-04-16 ENCOUNTER — Other Ambulatory Visit: Payer: Self-pay | Admitting: Neurology

## 2020-04-16 NOTE — Progress Notes (Unsigned)
Assessment/Plan:    1.  Essential Tremor, but wonder if dystonic component  -primidone, 50 mg, 3 tablets at bedtime  -Continue topiramate, 2100 mg at bed  -add gabapentin - 300 q hs x 1 week and then if tolerated, increase to 300 mg bid.  R/B/SE were discussed.  The opportunity to ask questions was given and they were answered to the best of my ability.  The patient expressed understanding and willingness to follow the outlined treatment protocols. 2.  Spasmodic dysphonia  -Sees ENT.  Does not want to repeat Botox (had it years ago)  Subjective:   Alexis Brady was seen today in follow up for essential tremor.  My previous records were reviewed prior to todays visit. Pt denies falls.  Pt states that tremor in the hands are worse.  Trouble writing things.  Has to hold one hand with the other in order to write.  Asks if there is other things to do.  "I have taught myself how to speak to people but the tremor in my hands is difficult is terrible."  Using a spoon more often.  Not sure if she would tolerate primidone in the day.  Current prescribed movement disorder medications: Primidone, 50 mg, 3 tablets at bed (increased last visit) Topiramate, 100 mg daily    ALLERGIES:  No Known Allergies  CURRENT MEDICATIONS:  Outpatient Encounter Medications as of 04/18/2020  Medication Sig  . calcium citrate-vitamin D (CITRACAL+D) 315-200 MG-UNIT per tablet Take 1 tablet by mouth 2 (two) times daily.   . cholecalciferol (VITAMIN D) 400 UNITS TABS tablet Take 400 Units by mouth 2 (two) times daily.  Marland Kitchen co-enzyme Q-10 30 MG capsule Take 100 mg by mouth daily.  . mesalamine (LIALDA) 1.2 g EC tablet TAKE 2 TABLETS BY MOUTH  DAILY WITH BREAKFAST-NEEDS APPT FOR FURTHER REFILLS  . naproxen sodium (ALEVE) 220 MG tablet Take 220 mg by mouth 2 (two) times daily. One in the morning, one at night  . primidone (MYSOLINE) 50 MG tablet Take 3 tablets (150 mg total) by mouth at bedtime.  . topiramate  (TOPAMAX) 100 MG tablet TAKE 1 TABLET(100 MG) BY MOUTH DAILY  . vitamin B-12 (CYANOCOBALAMIN) 1000 MCG tablet Take 1,000 mcg by mouth daily.   No facility-administered encounter medications on file as of 04/18/2020.     Objective:    PHYSICAL EXAMINATION:    VITALS:   Vitals:   04/18/20 0815  BP: 124/72  Pulse: 76  SpO2: 96%  Weight: 125 lb (56.7 kg)  Height: 5' 1"  (1.549 m)    GEN:  The patient appears stated age and is in NAD. HEENT:  Normocephalic, atraumatic.  The mucous membranes are moist.   Neurological examination:  Orientation: The patient is alert and oriented x3. Cranial nerves: There is good facial symmetry. The speech is fluent with spasmodic dysphonia. Soft palate rises symmetrically and there is no tongue deviation. Hearing is intact to conversational tone. Sensation: Sensation is intact to light touch throughout Motor: Strength is at least antigravity x4.  Movement examination: Tone: There is normal tone in the UE/LE Abnormal movements: she has little postural tremor but more mod when hands have a weight, L >R.  She has head tremor.   Coordination:  There is no decremation with RAM's, Gait and Station: The patient has no difficulty arising out of a deep-seated chair without the use of the hands. The patient's stride length is good I have reviewed and interpreted the following labs independently  Chemistry      Component Value Date/Time   NA 140 03/19/2015 1132   K 4.3 03/19/2015 1132   CL 110 03/19/2015 1132   CO2 23 03/19/2015 1132   BUN 23 (H) 03/19/2015 1132   CREATININE 0.86 03/19/2015 1132      Component Value Date/Time   CALCIUM 9.1 03/19/2015 1132      Lab Results  Component Value Date   WBC 8.0 03/19/2015   HGB 12.5 03/19/2015   HCT 39.5 03/19/2015   MCV 62.2 (L) 03/19/2015   PLT 182 03/19/2015   No results found for: TSH   Chemistry      Component Value Date/Time   NA 140 03/19/2015 1132   K 4.3 03/19/2015 1132   CL 110  03/19/2015 1132   CO2 23 03/19/2015 1132   BUN 23 (H) 03/19/2015 1132   CREATININE 0.86 03/19/2015 1132      Component Value Date/Time   CALCIUM 9.1 03/19/2015 1132         Total time spent on today's visit was 25 minutes, including both face-to-face time and nonface-to-face time.  Time included that spent on review of records (prior notes available to me/labs/imaging if pertinent), discussing treatment and goals, answering patient's questions and coordinating care.  Cc:  Marton Redwood, MD

## 2020-04-18 ENCOUNTER — Ambulatory Visit: Payer: Medicare Other | Admitting: Neurology

## 2020-04-18 ENCOUNTER — Encounter: Payer: Self-pay | Admitting: Neurology

## 2020-04-18 ENCOUNTER — Other Ambulatory Visit: Payer: Self-pay

## 2020-04-18 VITALS — BP 124/72 | HR 76 | Ht 61.0 in | Wt 125.0 lb

## 2020-04-18 DIAGNOSIS — G250 Essential tremor: Secondary | ICD-10-CM | POA: Diagnosis not present

## 2020-04-18 DIAGNOSIS — J383 Other diseases of vocal cords: Secondary | ICD-10-CM | POA: Diagnosis not present

## 2020-04-18 MED ORDER — PRIMIDONE 50 MG PO TABS: 150.0000 mg | ORAL_TABLET | Freq: Every day | ORAL | 1 refills | Status: DC

## 2020-04-18 MED ORDER — TOPIRAMATE 100 MG PO TABS: ORAL_TABLET | ORAL | 1 refills | Status: DC

## 2020-04-18 MED ORDER — GABAPENTIN 300 MG PO CAPS: 300.0000 mg | ORAL_CAPSULE | Freq: Two times a day (BID) | ORAL | 1 refills | Status: DC

## 2020-04-18 NOTE — Patient Instructions (Signed)
1.  Start gabapentin 300 mg - 1 at bedtime for a week and then if you tolerate it, 1 twice per day 2.  Continue the others (primidone and topiramate) as previous

## 2020-05-20 ENCOUNTER — Telehealth: Payer: Medicare Other | Admitting: Gastroenterology

## 2020-05-22 ENCOUNTER — Ambulatory Visit: Payer: Medicare Other | Admitting: Gastroenterology

## 2020-05-22 ENCOUNTER — Encounter: Payer: Self-pay | Admitting: Gastroenterology

## 2020-05-22 VITALS — BP 124/72 | HR 64 | Ht 61.0 in | Wt 127.4 lb

## 2020-05-22 DIAGNOSIS — K5130 Ulcerative (chronic) rectosigmoiditis without complications: Secondary | ICD-10-CM | POA: Diagnosis not present

## 2020-05-22 MED ORDER — MESALAMINE 1.2 G PO TBEC: DELAYED_RELEASE_TABLET | ORAL | 11 refills | Status: DC

## 2020-05-22 NOTE — Patient Instructions (Signed)
We have sent the following medications to your pharmacy for you to pick up at your convenience: [place].   Thank you for choosing me and [facility].  Pricilla Riffle. Dagoberto Ligas., MD., Marval Regal

## 2020-05-22 NOTE — Progress Notes (Signed)
    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 Colonoscopy 12/2018 showed diverticulosis and otherwise normal.  Colon biopsies were also normal.  She has no gastrointestinal complaints.  She states she takes prunes on a regular basis which keeps her bowel habits regular.  Current Medications, Allergies, Past Medical History, Past Surgical History, Family History and Social History were reviewed in [employer] record.   Physical Exam: General: Well developed, well nourished, no acute distress Head: Normocephalic and atraumatic Eyes: Sclerae anicteric, EOMI Ears: Normal auditory acuity Mouth: Not examined, mask on during Covid-19 pandemic Lungs: Clear throughout to auscultation Heart: Regular rate and rhythm; no murmurs, rubs or bruits Abdomen: Soft, non tender and non distended. No masses, hepatosplenomegaly or hernias noted. Normal Bowel sounds Rectal: Not done Musculoskeletal: Symmetrical with no gross deformities  Pulses:  Normal pulses noted Extremities: No clubbing, cyanosis, edema or deformities noted Neurological: Alert oriented x 4, grossly nonfocal Psychological:  Alert and cooperative. Normal mood and affect   Assessment and Recommendations:  1.  Ulcerative proctosigmoiditis, in remission.  Continue Lialda 2.4 g every morning.  Request blood work from Dr. Raul Del office. REV in 1 year.

## 2020-05-24 ENCOUNTER — Telehealth: Payer: Self-pay | Admitting: Neurology

## 2020-05-24 NOTE — Telephone Encounter (Signed)
Patient states the Neurontin is not helping with her hand but it is helping her speech.   Gave patient Dr Doristine Devoid recommendations and she voiced understanding.

## 2020-05-24 NOTE — Telephone Encounter (Signed)
Patient called and said started a new medication, gabapentin 342m but when she went up to two tablets, she feels like a "drunken soldier" and it is just too much at this dosage. Patient requesting to speak with a nurse or to Dr. TCarles Colletdirectly.   She said it is helping with her speech but she's worried about how sleepy she feels during the day.

## 2020-05-24 NOTE — Telephone Encounter (Signed)
She can just stay at the 1 tablet at night OR she can try both tablets at bed and see if it works that way without giving her hangover effect

## 2020-06-04 ENCOUNTER — Other Ambulatory Visit: Payer: Self-pay | Admitting: Gastroenterology

## 2020-06-05 ENCOUNTER — Telehealth: Payer: Self-pay | Admitting: Gastroenterology

## 2020-06-05 NOTE — Telephone Encounter (Signed)
Patient states her mesalamine went from $47 dollars a month to $97 a month. Patient states she is worried she may not be able to afford it in the future. Informed patient that unfortunately we do not know what is covered by her insurance company, and the best thing to do is to call the insurance company to see what is more affordable. Patient states Dr. Fuller Plan asked her at her last visit if she would like to stop taking mesalamine since she has been in remission for so many years to see if she does well coming off the medication. Informed patient that I will send this message to Dr. Fuller Plan.

## 2020-06-05 NOTE — Telephone Encounter (Signed)
Patient called and said the cost for the Mesalamine has gone up and she is concerned because she may not be able to continue the medication and is seeking advise.

## 2020-06-08 NOTE — Telephone Encounter (Signed)
Continue mesalamine, discontinue mesalamine and change to Azulfidine 1g po bid (if much less expensive for her) or no medication. All 3 are reasonable options. My preference is in the order I listed them.

## 2020-06-10 NOTE — Telephone Encounter (Signed)
Patient states she called the pharmacy and spoke to a different person that told her that the price they quoted her for her mesalamine was for a 3 month supply so her prescription has not went up in price. Patient states she will just stay on mesalamine since the price did not actually go up. Informed patient to contact our office if anything changes. Patient verbalized understanding.

## 2020-06-14 ENCOUNTER — Telehealth: Payer: Self-pay | Admitting: Neurology

## 2020-06-14 MED ORDER — PRIMIDONE 50 MG PO TABS: 200.0000 mg | ORAL_TABLET | Freq: Every day | ORAL | 1 refills | Status: DC

## 2020-06-14 NOTE — Telephone Encounter (Signed)
Short of adding more primidone, I don't have more options unfortunately.

## 2020-06-14 NOTE — Telephone Encounter (Signed)
Spoke with patient and gave her Dr Doristine Devoid recommendations.   She wanted know if she could take 4 tabs of primidone.   Spoke Dr Tat who states the patient can try 2 tabs bid or 4 tabs qhs. Dr Tat stated this may not help much but the patient can try it.  Patient made aware and voiced understanding.

## 2020-06-14 NOTE — Telephone Encounter (Signed)
Patient called in wanting to give an update on the gabapentin. When taking 1 at night and 1 in the morning, she felt a little woozy, but it wasn't helping her hands. She was told to increase to 2 at night and 2 in the morning if the original dose did not work. She stated that made her "feel like a drunken sailor" and did not help her hands. She would like to try something different. She is not taking the gabapentin anymore.

## 2020-07-16 ENCOUNTER — Other Ambulatory Visit (HOSPITAL_COMMUNITY): Payer: Self-pay | Admitting: Internal Medicine

## 2020-07-16 DIAGNOSIS — I350 Nonrheumatic aortic (valve) stenosis: Secondary | ICD-10-CM

## 2020-07-24 ENCOUNTER — Other Ambulatory Visit (HOSPITAL_COMMUNITY): Payer: Self-pay

## 2020-07-25 ENCOUNTER — Ambulatory Visit (HOSPITAL_COMMUNITY): Payer: Medicare Other

## 2020-08-05 ENCOUNTER — Other Ambulatory Visit: Payer: Self-pay

## 2020-08-05 ENCOUNTER — Telehealth: Payer: Self-pay | Admitting: Neurology

## 2020-08-05 MED ORDER — TOPIRAMATE 100 MG PO TABS: ORAL_TABLET | ORAL | 0 refills | Status: DC

## 2020-08-05 NOTE — Telephone Encounter (Signed)
Is she needing only 4 tablets of Topamax 156m? She is supposed to be taking 1 tablet every night. If only 4 tabs for emergency refill, that is okay. Thanks

## 2020-08-05 NOTE — Telephone Encounter (Signed)
Patient called and is requesting help with her Topamax 187m for four tablets.  She is on vacation and she is going to buy the Primidone as she has a current prescription.  For the Topamax, she does not have any refills left so she needs a new prescription sent in as soon as possible.  Patient provided the pharmacists number: [phone] Dunkeris the pharmacists personal number.  [facility] [address]

## 2020-08-05 NOTE — Telephone Encounter (Signed)
Emergency refill sent for pt to pharmacy requested

## 2020-09-16 NOTE — Progress Notes (Signed)
Assessment/Plan:    1.  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  -Continue topiramate, 100 mg at bed  -Discussed with patient again that there are few other non surgical options.  We have not used propranolol, primarily because she has some lung function disease, but there is some data to suggest that propranolol does not really affect this is much as we had previously thought.  I do not see that she is even following with pulmonology.  However, her BP was so low that I don't want to add a BP medication.  -discussed surgical options.  She isn't interested in DBS but is interested in a consult with [facility] for focused ultrasound.  We will put in a consult. 2.  Spasmodic dysphonia  -Sees ENT.  Does not want to repeat Botox (had it years ago)  Subjective:   Alexis Brady was seen today in follow up for essential tremor.  My previous records were reviewed prior to todays visit.  Unfortunately, tremor continues to be fairly disabling.  We tried to add gabapentin at bedtime to see if that would help, but she felt like a "drunken soldier."  However, it does look like potentially she took more than she should have.  She was directed to start at 300 mg at night and then work to 300 mg twice per day.  When she called, she stated that 300 mg twice per day was not helping, so she went up to 2 tablets in the morning and 2 tablets at night (stated that she was told to do this, but I do not see any directives about that), and that is when she felt like a "drunken sailor."  It also really did not help and she ultimately ended up discontinuing the medication.  She asked what else she could do and wanted to increase the primidone, which we let her do.  However, we told her that there likely really were not other options, short of surgical options.  She found that taking all 4 of the primidone at night also made her dizzy so  she is doing 2 po bid.  She states that her handwriting is "terrible" and may be "worse."  States that she has trained her voice to speak to people so that they understand her better.  It has worked well for her.  Current prescribed movement disorder medications: Primidone, 50 mg, 4 tablets at bed (increased since last visit) Topiramate, 100 mg daily  Prior medications: Gabapentin, 300 mg, 2 tablets twice per day; on topiramate (but does have history of nephrolithiasis)  ALLERGIES:  No Known Allergies  CURRENT MEDICATIONS:  Outpatient Encounter Medications as of 09/17/2020  Medication Sig   calcium citrate-vitamin D (CITRACAL+D) 315-200 MG-UNIT per tablet Take 1 tablet by mouth 2 (two) times daily.    cholecalciferol (VITAMIN D) 400 UNITS TABS tablet Take 400 Units by mouth 2 (two) times daily.   co-enzyme Q-10 30 MG capsule Take 100 mg by mouth daily.   gabapentin (NEURONTIN) 300 MG capsule Take 1 capsule (300 mg total) by mouth 2 (two) times daily.   mesalamine (LIALDA) 1.2 g EC tablet TAKE 2 TABLETS BY MOUTH DAILY WITH BREAKFAST   naproxen sodium (ALEVE) 220 MG tablet Take 220 mg by mouth 2 (two) times daily. One in the morning, one at night   primidone (MYSOLINE) 50 MG tablet Take 4 tablets (200 mg total) by mouth  at bedtime. (Patient taking differently: Take 200 mg by mouth at bedtime. Patient takes two tablets in the morning and two tablets at night)   topiramate (TOPAMAX) 100 MG tablet TAKE 1 TABLET(100 MG) BY MOUTH DAILY   vitamin B-12 (CYANOCOBALAMIN) 1000 MCG tablet Take 1,000 mcg by mouth daily.   No facility-administered encounter medications on file as of 09/17/2020.     Objective:    PHYSICAL EXAMINATION:    VITALS:   Vitals:   09/17/20 0813  BP: 92/60  Pulse: 74  SpO2: 95%  Weight: 123 lb (55.8 kg)  Height: 5' 1"  (1.549 m)     GEN:  The patient appears stated age and is in NAD. HEENT:  Normocephalic, atraumatic.  The mucous membranes are moist.    Neurological examination:  Orientation: The patient is alert and oriented x3. Cranial nerves: There is good facial symmetry. The speech is fluent with spasmodic dysphonia. Soft palate rises symmetrically and there is no tongue deviation. Hearing is intact to conversational tone. Sensation: Sensation is intact to light touch throughout Motor: Strength is at least antigravity x4.  Movement examination: Tone: There is normal tone in the UE/LE Abnormal movements: she has little postural tremor but more mod when hands have a weight, L >R.  She has head tremor in the yes direction.  Has trouble with archimedes spirals bilaterally. Coordination:  There is no decremation with RAM's,  I have reviewed and interpreted the following labs independently   Chemistry      Component Value Date/Time   NA 140 03/19/2015 1132   K 4.3 03/19/2015 1132   CL 110 03/19/2015 1132   CO2 23 03/19/2015 1132   BUN 23 (H) 03/19/2015 1132   CREATININE 0.86 03/19/2015 1132      Component Value Date/Time   CALCIUM 9.1 03/19/2015 1132      Lab Results  Component Value Date   WBC 8.0 03/19/2015   HGB 12.5 03/19/2015   HCT 39.5 03/19/2015   MCV 62.2 (L) 03/19/2015   PLT 182 03/19/2015   No results found for: TSH   Chemistry      Component Value Date/Time   NA 140 03/19/2015 1132   K 4.3 03/19/2015 1132   CL 110 03/19/2015 1132   CO2 23 03/19/2015 1132   BUN 23 (H) 03/19/2015 1132   CREATININE 0.86 03/19/2015 1132      Component Value Date/Time   CALCIUM 9.1 03/19/2015 1132         Total time spent on today's visit was 30 minutes, including both face-to-face time and nonface-to-face time.  Time included that spent on review of records (prior notes available to me/labs/imaging if pertinent), discussing treatment and goals, answering patient's questions and coordinating care.  Cc:  Ginger Organ., MD

## 2020-09-17 ENCOUNTER — Encounter: Payer: Self-pay | Admitting: Neurology

## 2020-09-17 ENCOUNTER — Other Ambulatory Visit: Payer: Self-pay

## 2020-09-17 ENCOUNTER — Ambulatory Visit: Payer: Medicare Other | Admitting: Neurology

## 2020-09-17 VITALS — BP 92/60 | HR 74 | Ht 61.0 in | Wt 123.0 lb

## 2020-09-17 DIAGNOSIS — G250 Essential tremor: Secondary | ICD-10-CM

## 2020-09-17 DIAGNOSIS — J383 Other diseases of vocal cords: Secondary | ICD-10-CM

## 2020-09-17 MED ORDER — PRIMIDONE 50 MG PO TABS: 100.0000 mg | ORAL_TABLET | Freq: Two times a day (BID) | ORAL | 1 refills | Status: DC

## 2020-09-17 NOTE — Patient Instructions (Signed)
We will send a referral to [facility] for focused ultrasound.  Please let us know if you don't hear from them.  We will make you an appt here in 6-8 months but if you don't need it because you have had the procedure, just call us and cancel.

## 2020-09-18 ENCOUNTER — Other Ambulatory Visit (HOSPITAL_COMMUNITY): Payer: Self-pay | Admitting: *Deleted

## 2020-09-19 ENCOUNTER — Ambulatory Visit (HOSPITAL_COMMUNITY)
Admission: RE | Admit: 2020-09-19 | Discharge: 2020-09-19 | Disposition: A | Discharge status: Home / Self Care | Payer: Medicare Other | Source: Ambulatory Visit | Attending: Internal Medicine | Admitting: Internal Medicine

## 2020-09-19 ENCOUNTER — Other Ambulatory Visit: Payer: Self-pay

## 2020-09-19 DIAGNOSIS — M810 Age-related osteoporosis without current pathological fracture: Secondary | ICD-10-CM | POA: Insufficient documentation

## 2020-09-19 DIAGNOSIS — G250 Essential tremor: Secondary | ICD-10-CM

## 2020-09-19 MED ORDER — ZOLEDRONIC ACID 5 MG/100ML IV SOLN
INTRAVENOUS | Status: AC
  Administered 2020-09-19: 5 mg via INTRAVENOUS
  Filled 2020-09-19: qty 100

## 2020-09-19 MED ORDER — ZOLEDRONIC ACID 5 MG/100ML IV SOLN: 5.0000 mg | Freq: Once | INTRAVENOUS | Status: AC

## 2020-10-11 ENCOUNTER — Telehealth: Payer: Self-pay | Admitting: Neurology

## 2020-10-11 NOTE — Telephone Encounter (Signed)
Not sure what she is talking about but may be talking about focused ultrasound at [facility]?  I think that we put that referral in last visit??  Please check on that and on referral

## 2020-10-11 NOTE — Telephone Encounter (Signed)
Patient called and said she wants Dr. Carles Collet to know she is interested in the [facility] trial that may help with her tremors in her hands.   She said Dr. Carles Collet told her to call if she is interested so she can get some more information.

## 2020-10-15 NOTE — Telephone Encounter (Signed)
Patient left a voice mail returning Mahina's call.

## 2020-10-15 NOTE — Telephone Encounter (Signed)
Called patient and left a message for a call back.  

## 2020-10-16 NOTE — Telephone Encounter (Signed)
Called patient and ask if she is referring to the focused u/s. Patient stated yes. Advised patient that a referral has been sent and provided patient with [facility] clinic number [phone]. Patient stated she will give them a call and will call me if she has any issues.   Patient had no further questions or concerns.

## 2020-10-21 ENCOUNTER — Other Ambulatory Visit: Payer: Self-pay | Admitting: Urology

## 2020-10-21 DIAGNOSIS — N200 Calculus of kidney: Secondary | ICD-10-CM

## 2020-10-25 ENCOUNTER — Telehealth: Payer: Self-pay | Admitting: Neurology

## 2020-10-25 NOTE — Telephone Encounter (Signed)
Pt called in to speak with mahina or tat. She is still waiting on info for referral from what I gathered.

## 2020-10-25 NOTE — Telephone Encounter (Signed)
Pt called back to get more information no answer left a voice mail to call the office back

## 2020-10-28 NOTE — Telephone Encounter (Signed)
Patient called and let me know that [facility] has not gotten the referral. Informed patient that the referral was faxed successful and that I would call and follow up on her referral. Informed patient that if she does not hear back from me today, I will call her tomorrow.   Patient verbalized understanding.

## 2020-10-29 NOTE — Telephone Encounter (Signed)
Called [facility] and asked if they have received the referral for patient. They stated that they have not. Provided another fax #: [phone] and have re-faxed referral. Also received correct number for scheduling: [phone] press option #1 twice.   Pending referral fax confirmation.

## 2020-10-30 NOTE — Telephone Encounter (Signed)
Called [facility] and asked if they have received patients referral and they confirmed that they have it.   Called patient and per DPR left a message letting the patient know that I have called [facility] and confirmed that they have received her referral for a focus u/s. I provided the patient with their scheduling number [phone] and to press option #1 twice. Informed patient to call me if she has any more question, issues, or concerns.

## 2020-10-31 ENCOUNTER — Other Ambulatory Visit: Payer: Self-pay | Admitting: Neurology

## 2020-11-07 NOTE — Progress Notes (Signed)
Left message for patient to return call for ESWL instructions.

## 2020-11-08 NOTE — Progress Notes (Signed)
Talked to patient. Very frail sounding. In [place] at this time. Son will be the driver. Instructions given arrival time 0600 meds and hx reviewed.

## 2020-11-10 NOTE — H&P (Signed)
CC: Right-sided flank pain, microscopic hematuria  HPI:  02/07/2019  Yesterday, patient underwent a CT of the abdomen and pelvis without contrast. This revealed bilateral nonobstructing renal calculi. She was also found to have right-sided hydroureteronephrosis down to level the bladder. There is no obvious lesion however there was no contrast. GFR yesterday was 53 with a creatinine 1. She has persistent microscopic hematuria today. Pain has resolved. She is currently taking antibiotics for concern for infection given her right-sided flank pain and previous urine sample suggesting possible infection. She smoked cigarettes when she was younger but quit when her son was 57, who is now 87.   HPI:  02/2019  Patient underwent a CT IVP. This revealed a 1 cm left upper pole nonobstructing calculus. No stones on the right. No explanation for her pain on the right. She presents today for cystoscopy. There was no evidence of malignancy on CT scan.   03/25/20: Patient with above hx. She returns today for annual evaluation. She states that over the past year she has been having intermittent episodes of bilateral flank pain. She states that at times she will have pain in her right flank that radiates to her RLQ. She as similar complaints on the left. She typically does not have bilateral pain at the same time. She also states that she has been having intermittent episodes of gross hematuria. She states that her most recent episode was approximatly two weeks ago. She did have some right sided flank pain at this time. She denies dysuria or increase voiding symptoms. No fevers, chills, nausea, or vomiting. She denies interval passage of stone material.   04/19/20: Patient with above hx. She returns today for follow up evaluation. KUB at prior OV showed an approximatly 10 mm left renal calculus. No overt hydronephrosis was noted on RUS. Urine culture was noted to be positive for Staphylococcus saprophyticus and she was  treated accordingly. Today she states that symptoms have improved. She denies any interval episodes of gross hematuria since completion of antibiotic therapy. She denies any ongoing unilateral flank pain. She denies dysuria, gross hematuria, fever, chills, nausea, or vomiting.   10/18/2020  Patient underwent a renal ultrasound that revealed moderate to severe hydronephrosis and some cortical thinning on the left. This is a sign that the stone has moved into the ureter. KUB clearly shows the stone in the left ureteropelvic junction / proximal ureter. She has had no pain, fever, nausea, vomiting.     ALLERGIES: None   MEDICATIONS: Mesalamine  Primidone 50 mg tablet 1 tablet PO Daily  Topiramate 100 mg tablet 1 tablet PO Daily     Notes: patient on new medication once a day for tremor in hands   GU PSH: Cystoscopy - 02/27/2019 Locm 300-399Mg/Ml Iodine,1Ml - 02/24/2019     NON-GU PSH: None   GU PMH: Gross hematuria - 04/19/2020, - 03/25/2020 Renal calculus - 04/19/2020, - 03/25/2020, - 02/07/2019 Microscopic hematuria - 03/25/2020, - 02/07/2019 Flank Pain - 02/07/2019 Hydronephrosis - 02/07/2019    NON-GU PMH: Asthma Cardiac murmur, unspecified Encounter for general adult medical examination without abnormal findings, Encounter for preventive health examination    FAMILY HISTORY: Family History Unknown    SOCIAL HISTORY: Marital Status: Widowed Preferred Language: English; Race: White Current Smoking Status: Patient has never smoked.   Tobacco Use Assessment Completed: Used Tobacco in last 30 days?    REVIEW OF SYSTEMS:    GU Review Female:   Patient denies frequent urination, stream starts and stops, being pregnant,  have to strain to urinate, get up at night to urinate, burning /pain with urination, leakage of urine, trouble starting your stream, and hard to postpone urination.  Gastrointestinal (Upper):   Patient denies nausea, vomiting, and indigestion/ heartburn.  Gastrointestinal  (Lower):   Patient denies diarrhea and constipation.  Constitutional:   Patient denies fever, night sweats, weight loss, and fatigue.  Skin:   Patient denies skin rash/ lesion and itching.  Eyes:   Patient denies blurred vision and double vision.  Ears/ Nose/ Throat:   Patient denies sore throat and sinus problems.  Hematologic/Lymphatic:   Patient denies swollen glands and easy bruising.  Cardiovascular:   Patient denies leg swelling and chest pains.  Respiratory:   Patient denies cough and shortness of breath.  Endocrine:   Patient denies excessive thirst.  Musculoskeletal:   Patient denies back pain and joint pain.  Neurological:   Patient denies headaches and dizziness.  Psychologic:   Patient denies depression and anxiety.   VITAL SIGNS: None   Complexity of Data:  Source Of History:  Patient  Records Review:   Previous Patient Records  Urine Test Review:   Urinalysis  X-Ray Review: KUB: Reviewed Films. Discussed With Patient.  Renal Ultrasound: Reviewed Films. Discussed With Patient.     PROCEDURES:         KUB - [id number]  A single view of the abdomen is obtained.  1 cm radiopaque stone in the left proximal ureter /UPJ      . Patient confirmed No Neulasta OnPro Device.            Renal Ultrasound - [id number]  Right Kidney: Length: 9.0cm Depth: 6.4 cm Cortical Width: 1.1 cm Width: 5.4 cm  Left Kidney: Length: 8.4 cm Depth: 5.7 cm Cortical Width: .53 cm Width: 5.4 cm  Left Kidney/Ureter:  moderate/severe hydro noted stone in the LP ? if in the ureter   Right Kidney/Ureter:  1) dilated proximal ureter 2) 1 calc in the LP non-obst  Bladder:  PVR= 23.67m               Urinalysis w/Scope Dipstick Dipstick Cont'd Micro  Color: Yellow Bilirubin: Neg mg/dL WBC/hpf: 0 - 5/hpf  Appearance: Clear Ketones: Neg mg/dL RBC/hpf: 0 - 2/hpf  Specific Gravity: 1.025 Blood: Neg ery/uL Bacteria: Few (10-25/hpf)  pH: 6.0 Protein: Neg mg/dL Cystals: NS (Not Seen)  Glucose: Neg mg/dL  Urobilinogen: 0.2 mg/dL Casts: NS (Not Seen)    Nitrites: Neg Trichomonas: Not Present    Leukocyte Esterase: 2+ leu/uL Mucous: Not Present      Epithelial Cells: 0 - 5/hpf      Yeast: NS (Not Seen)      Sperm: Not Present    ASSESSMENT:      ICD-10 Details  1 GU:   Hydronephrosis - N13.0 Chronic, Worsening  2   Renal calculus - N20.0 Chronic, Worsening     PLAN:           Orders Labs Urine Culture          Document Letter(s):  Created for Patient: Clinical Summary         Notes:   We discussed the management of urinary stones. These options include observation, ureteroscopy, and shockwave lithotripsy. We discussed which options are relevant to these particular stones. We discussed the natural history of stones as well as the complications of untreated stones and the impact on quality of life without treatment as well as with each of the above listed  treatments. We also discussed the efficacy of each treatment in its ability to clear the stone burden. With any of these management options I discussed the signs and symptoms of infection and the need for emergent treatment should these be experienced. For each option we discussed the ability of each procedure to clear the patient of their stone burden.   For observation I described the risks which include but are not limited to silent renal damage, life-threatening infection, need for emergent surgery, failure to pass stone, and pain.   For ureteroscopy I described the risks which include heart attack, stroke, pulmonary embolus, death, bleeding, infection, damage to contiguous structures, positioning injury, ureteral stricture, ureteral avulsion, ureteral injury, need for ureteral stent, inability to perform ureteroscopy, need for an interval procedure, inability to clear stone burden, stent discomfort and pain.   For shockwave lithotripsy I described the risks which include arrhythmia, kidney contusion, kidney hemorrhage, need for  transfusion, pain, inability to break up stone, inability to pass stone fragments, Steinstrasse, infection associated with obstructing stones, need for different surgical procedure, need for repeat shockwave lithotripsy.   She would like to proceed with ESWL.   Cc: Dr. Brigitte Pulse

## 2020-11-11 ENCOUNTER — Ambulatory Visit (HOSPITAL_BASED_OUTPATIENT_CLINIC_OR_DEPARTMENT_OTHER)
Admission: RE | Admit: 2020-11-11 | Discharge: 2020-11-11 | Disposition: A | Discharge status: Home / Self Care | Payer: Medicare Other | Attending: Urology | Admitting: Urology

## 2020-11-11 ENCOUNTER — Other Ambulatory Visit: Payer: Self-pay

## 2020-11-11 ENCOUNTER — Encounter (HOSPITAL_BASED_OUTPATIENT_CLINIC_OR_DEPARTMENT_OTHER): Payer: Self-pay | Admitting: Urology

## 2020-11-11 ENCOUNTER — Ambulatory Visit (HOSPITAL_COMMUNITY): Payer: Medicare Other

## 2020-11-11 ENCOUNTER — Encounter (HOSPITAL_BASED_OUTPATIENT_CLINIC_OR_DEPARTMENT_OTHER)
Admission: RE | Disposition: A | Discharge status: Home / Self Care | Payer: Self-pay | Source: Home / Self Care | Attending: Urology

## 2020-11-11 DIAGNOSIS — D649 Anemia, unspecified: Secondary | ICD-10-CM | POA: Diagnosis not present

## 2020-11-11 DIAGNOSIS — J45909 Unspecified asthma, uncomplicated: Secondary | ICD-10-CM | POA: Diagnosis not present

## 2020-11-11 DIAGNOSIS — Z79899 Other long term (current) drug therapy: Secondary | ICD-10-CM | POA: Insufficient documentation

## 2020-11-11 DIAGNOSIS — K5190 Ulcerative colitis, unspecified, without complications: Secondary | ICD-10-CM | POA: Insufficient documentation

## 2020-11-11 DIAGNOSIS — N200 Calculus of kidney: Secondary | ICD-10-CM

## 2020-11-11 DIAGNOSIS — R011 Cardiac murmur, unspecified: Secondary | ICD-10-CM | POA: Diagnosis not present

## 2020-11-11 DIAGNOSIS — N132 Hydronephrosis with renal and ureteral calculous obstruction: Secondary | ICD-10-CM | POA: Insufficient documentation

## 2020-11-11 DIAGNOSIS — R310 Gross hematuria: Secondary | ICD-10-CM | POA: Insufficient documentation

## 2020-11-11 DIAGNOSIS — G250 Essential tremor: Secondary | ICD-10-CM | POA: Diagnosis not present

## 2020-11-11 HISTORY — PX: EXTRACORPOREAL SHOCK WAVE LITHOTRIPSY: SHX1557

## 2020-11-11 SURGERY — LITHOTRIPSY, ESWL
Anesthesia: LOCAL | Laterality: Left

## 2020-11-11 MED ORDER — CIPROFLOXACIN HCL 500 MG PO TABS
500.0000 mg | ORAL_TABLET | ORAL | Status: AC
  Administered 2020-11-11: 500 mg via ORAL

## 2020-11-11 MED ORDER — DIAZEPAM 5 MG PO TABS
ORAL_TABLET | ORAL | Status: AC
  Filled 2020-11-11: qty 2

## 2020-11-11 MED ORDER — HYDROCODONE-ACETAMINOPHEN 5-325 MG PO TABS: 1.0000 | ORAL_TABLET | Freq: Four times a day (QID) | ORAL | 0 refills | Status: DC | PRN

## 2020-11-11 MED ORDER — CIPROFLOXACIN HCL 500 MG PO TABS
ORAL_TABLET | ORAL | Status: AC
  Filled 2020-11-11: qty 1

## 2020-11-11 MED ORDER — SODIUM CHLORIDE 0.9 % IV SOLN: INTRAVENOUS | Status: DC

## 2020-11-11 MED ORDER — DIPHENHYDRAMINE HCL 25 MG PO CAPS
25.0000 mg | ORAL_CAPSULE | ORAL | Status: AC
  Administered 2020-11-11: 25 mg via ORAL

## 2020-11-11 MED ORDER — SODIUM CHLORIDE 0.9% FLUSH: 3.0000 mL | Freq: Two times a day (BID) | INTRAVENOUS | Status: DC

## 2020-11-11 MED ORDER — DIPHENHYDRAMINE HCL 25 MG PO CAPS
ORAL_CAPSULE | ORAL | Status: AC
  Filled 2020-11-11: qty 1

## 2020-11-11 MED ORDER — DIAZEPAM 5 MG PO TABS
10.0000 mg | ORAL_TABLET | ORAL | Status: AC
  Administered 2020-11-11: 10 mg via ORAL

## 2020-11-11 NOTE — Op Note (Signed)
1st stage Left ESWL.  See [facility] note for details.

## 2020-11-11 NOTE — Interval H&P Note (Signed)
History and Physical Interval Note:  No change in stone on KUB  11/11/2020 7:38 AM  Alexis Brady  has presented today for surgery, with the diagnosis of LEFT RENAL STONE.  The various methods of treatment have been discussed with the patient and family. After consideration of risks, benefits and other options for treatment, the patient has consented to  Procedure(s): LEFT EXTRACORPOREAL SHOCK WAVE LITHOTRIPSY (ESWL) (Left) as a surgical intervention.  The patient's history has been reviewed, patient examined, no change in status, stable for surgery.  I have reviewed the patient's chart and labs.  Questions were answered to the patient's satisfaction.     Irine Seal

## 2020-11-12 ENCOUNTER — Encounter (HOSPITAL_BASED_OUTPATIENT_CLINIC_OR_DEPARTMENT_OTHER): Payer: Self-pay | Admitting: Urology

## 2020-11-27 ENCOUNTER — Other Ambulatory Visit: Payer: Self-pay | Admitting: Neurology

## 2020-11-27 DIAGNOSIS — G250 Essential tremor: Secondary | ICD-10-CM

## 2020-11-27 DIAGNOSIS — J383 Other diseases of vocal cords: Secondary | ICD-10-CM

## 2020-12-09 ENCOUNTER — Other Ambulatory Visit: Payer: Self-pay | Admitting: Urology

## 2020-12-17 ENCOUNTER — Encounter (HOSPITAL_BASED_OUTPATIENT_CLINIC_OR_DEPARTMENT_OTHER): Payer: Self-pay | Admitting: Urology

## 2020-12-17 ENCOUNTER — Other Ambulatory Visit: Payer: Self-pay

## 2020-12-17 NOTE — Progress Notes (Addendum)
Spoke w/ via phone for pre-op interview---pt Lab needs dos----    I stat, ekg           Lab results------see below COVID test -----patient states asymptomatic no test needed Arrive at -------930 am 12-23-2020 NPO after MN NO Solid Food.   water from MN until---830 am Med rec completed Medications to take morning of surgery -----primidone, topirimate Diabetic medication -----n/a Patient instructed no nail polish to be worn day of surgery Patient instructed to bring photo id and insurance card day of surgery Patient aware to have Driver (ride ) / caregiver  son joe Patient Special Instructions -----none Pre-Op special Istructions -----none Patient verbalized understanding of instructions that were given at this phone interview. Patient denies shortness of breath, chest pain, fever, cough at this phone interview.   Anesthesia Review: hx of essential tremor, spasmodic dysphonia, vocal tremor, moderate aortic stenosis, pt denies trouble chewing or swallowing pills, pt denies cardiac s & s or sob at pre op call   Addendum: spoke with dr Vito Backers bass mda and reviewed pt medical history and per dr Vito Backers bass mda pt needs to have echo done and see cardiology before having 12-23-2020 surgery with dr bell, called pam gibson and made aware pt needs to see cardiology and have echo done prior to 12-23-2020 surgery.  PCP: dr Marton Redwood Cardiologist :lov laura ingold np 07-17-2018 f/u in 6 months, echo scheduled for  Chest x-ray :none EKG :06-22-2016 epic Echo : 03-23-2019 epic Stress test:none Cardiac Cath : none Activity level: does own housework, can climba flight of stairs without dofficulty Sleep Study/ CPAP :none  Lov neurology dr Herby Abraham neurology [facility] epic [facility] neurology dr tat 09-17-2020 epic Brain ct 12-10-2020 [facility]

## 2020-12-19 NOTE — Progress Notes (Signed)
Spoke with  dr Vito Backers bass mda and pt ok to have surgery 1st week in november if echo done prior to surgery and echo is stable per cassandra bass mda

## 2021-01-01 ENCOUNTER — Ambulatory Visit (HOSPITAL_COMMUNITY): Payer: Medicare Other | Attending: Cardiology

## 2021-01-01 ENCOUNTER — Other Ambulatory Visit: Payer: Self-pay

## 2021-01-01 DIAGNOSIS — I350 Nonrheumatic aortic (valve) stenosis: Secondary | ICD-10-CM | POA: Insufficient documentation

## 2021-01-01 HISTORY — DX: Nonrheumatic aortic (valve) stenosis: I35.0

## 2021-01-01 LAB — ECHOCARDIOGRAM COMPLETE
AR max vel: 0.74 cm2
AV Area VTI: 0.71 cm2
AV Area mean vel: 0.67 cm2
AV Mean grad: 30 mmHg
AV Peak grad: 52.7 mmHg
Ao pk vel: 3.63 m/s
Area-P 1/2: 2.5 cm2
P 1/2 time: 997 msec
S' Lateral: 2.4 cm

## 2021-01-09 ENCOUNTER — Telehealth: Payer: Self-pay | Admitting: Neurology

## 2021-01-09 NOTE — Telephone Encounter (Signed)
Pt left a mess with the AN.she was wondering if tat knew she had a procedure to be done in November. Called pt but no answer to see what procedure

## 2021-01-14 NOTE — Progress Notes (Signed)
Left message with pam gibson spoke with dr Vito Backers bass mda and showed dr bass the results of 01-01-2021 echo and pt needs to be done at [facility] and needs cardiac clearance for surgery with dr bell. Per dr Delbert Phenix bell mda due to moderate to severe aortic stenosis

## 2021-01-15 ENCOUNTER — Encounter (HOSPITAL_COMMUNITY): Payer: Self-pay

## 2021-01-15 ENCOUNTER — Telehealth: Payer: Self-pay | Admitting: Cardiology

## 2021-01-15 MED ORDER — LACTATED RINGERS IV SOLN: INTRAVENOUS | Status: DC

## 2021-01-15 NOTE — Telephone Encounter (Signed)
   Name: Alexis Brady  DOB: [birth year]  MRN: [id number]  Primary Cardiologist: Ena Dawley, MD (Inactive)  Chart reviewed as part of pre-operative protocol coverage. Because of Alexis Brady's past medical history and time since last visit, she will require a follow-up visit in order to better assess preoperative cardiovascular risk.  Last OV was >1 year ago. She had a telemedicine OV in 06/2018 but has not been seen in clinic since 2018.   Pre-op covering staff: - Please schedule appointment and call patient to inform them. - Please contact requesting surgeon's office via preferred method (i.e, phone, fax) to inform them of need for appointment prior to surgery.  No antiplatelets listed on MAR to hold.  Charlie Pitter, PA-C  01/15/2021, 8:59 AM

## 2021-01-15 NOTE — Telephone Encounter (Signed)
   [facility] Pre-operative Risk Assessment    Patient Name: Alexis Brady  DOB: Nov 16, 1940 MRN: [id number]  HEARTCARE STAFF:  - IMPORTANT!!!!!! Under Visit Info/Reason for Call, type in Other and utilize the format Clearance MM/DD/YY or Clearance TBD. Do not use dashes or single digits. - Please review there is not already an duplicate clearance open for this procedure. - If request is for dental extraction, please clarify the # of teeth to be extracted. - If the patient is currently at the dentist's office, call Pre-Op Callback Staff (MA/nurse) to input urgent request.  - If the patient is not currently in the dentist office, please route to the Pre-Op pool.  Request for surgical clearance:  What type of surgery is being performed? Ureteroscopy  When is this surgery scheduled? 01/22/21  What type of clearance is required (medical clearance vs. Pharmacy clearance to hold med vs. Both)? Medical  Are there any medications that need to be held prior to surgery and how long? no  Practice name and name of physician performing surgery? Dr. Link Snuffer, [facility]   What is the office phone number? [phone] x [phone]   7.   What is the office fax number? [phone]  8.   Anesthesia type (None, local, MAC, general) ? General    [name] 01/15/2021, 8:13 AM  _________________________________________________________________   (provider comments below)

## 2021-01-15 NOTE — Patient Instructions (Signed)
DUE TO COVID-19 ONLY ONE VISITOR IS ALLOWED TO COME WITH YOU AND STAY IN THE WAITING ROOM ONLY DURING PRE OP AND PROCEDURE.   **NO VISITORS ARE ALLOWED IN THE SHORT STAY AREA OR RECOVERY ROOM!!**   Your procedure is scheduled on: Wednesday, Nov. 2, 2022   Report to [facility] Main Entrance    Report to admitting at 8:15 AM   Call this number if you have problems the morning of surgery [phone]   Do not eat food :After Midnight.   May have liquids until 7:30 AM   day of surgery  CLEAR LIQUID DIET  Foods Allowed                                                                     Foods Excluded  Water, Black Coffee and tea, regular and decaf                             liquids that you cannot  Plain Jell-O in any flavor  (No red)                                           see through such as: Fruit ices (not with fruit pulp)                                     milk, soups, orange juice              Iced Popsicles (No red)                                    All solid food                                   Apple juices Sports drinks like Gatorade (No red) Lightly seasoned clear broth or consume(fat free) Sugar  Sample Menu Breakfast                                Lunch                                     Supper Cranberry juice                    Beef broth                            Chicken broth Jell-O                                     Grape juice  Apple juice Coffee or tea                        Jell-O                                      Popsicle                                                Coffee or tea                        Coffee or tea       Oral Hygiene is also important to reduce your risk of infection.                                    Remember - BRUSH YOUR TEETH THE MORNING OF SURGERY WITH YOUR REGULAR TOOTHPASTE   Do NOT smoke after Midnight   Take these medicines the morning of surgery with A SIP OF WATER: Mesalamine  (Lialda) Primidone (Mysoline) Topiramate (Topamax)                              You may not have any metal on your body including hair pins, jewelry, and body piercing             Do not wear make-up, lotions, powders, perfumes/cologne, or deodorant  Do not wear nail polish including gel and S&S, artificial/acrylic nails, or any other type of covering on natural nails including finger and toenails. If you have artificial nails, gel coating, etc. that needs to be removed by a nail salon please have this removed prior to surgery or surgery may need to be canceled/ delayed if the surgeon/ anesthesia feels like they are unable to be safely monitored.   Do not shave  48 hours prior to surgery.    Do not bring valuables to the hospital. [place].   Contacts, dentures or bridgework may not be worn into surgery.    Patients discharged on the day of surgery will not be allowed to drive home.   Special Instructions: Bring a copy of your healthcare power of attorney and living will documents         the day of surgery if you haven't scanned them before.              Please read over the following fact sheets you were given: IF YOU HAVE QUESTIONS ABOUT YOUR PRE-OP INSTRUCTIONS PLEASE CALL [phone]     [facility] - Preparing for Surgery Before surgery, you can play an important role.  Because skin is not sterile, your skin needs to be as free of germs as possible.  You can reduce the number of germs on your skin by washing with CHG (chlorahexidine gluconate) soap before surgery.  CHG is an antiseptic cleaner which kills germs and bonds with the skin to continue killing germs even after washing. Please DO NOT use if you have an allergy to CHG or  antibacterial soaps.  If your skin becomes reddened/irritated stop using the CHG and inform your nurse when you arrive at Short Stay. Do not shave (including legs and underarms) for at least 48 hours prior to  the first CHG shower.  You may shave your face/neck.  Please follow these instructions carefully:  1.  Shower with CHG Soap the night before surgery and the  morning of surgery.  2.  If you choose to wash your hair, wash your hair first as usual with your normal  shampoo.  3.  After you shampoo, rinse your hair and body thoroughly to remove the shampoo.                             4.  Use CHG as you would any other liquid soap.  You can apply chg directly to the skin and wash.  Gently with a scrungie or clean washcloth.  5.  Apply the CHG Soap to your body ONLY FROM THE NECK DOWN.   Do   not use on face/ open                           Wound or open sores. Avoid contact with eyes, ears mouth and   genitals (private parts).                       Wash face,  Genitals (private parts) with your normal soap.             6.  Wash thoroughly, paying special attention to the area where your    surgery  will be performed.  7.  Thoroughly rinse your body with warm water from the neck down.  8.  DO NOT shower/wash with your normal soap after using and rinsing off the CHG Soap.                9.  Pat yourself dry with a clean towel.            10.  Wear clean pajamas.            11.  Place clean sheets on your bed the night of your first shower and do not  sleep with pets. Day of Surgery : Do not apply any lotions/deodorants the morning of surgery.  Please wear clean clothes to the hospital/surgery center.  FAILURE TO FOLLOW THESE INSTRUCTIONS MAY RESULT IN THE CANCELLATION OF YOUR SURGERY  PATIENT SIGNATURE_________________________________  NURSE SIGNATURE__________________________________  ________________________________________________________________________

## 2021-01-16 ENCOUNTER — Other Ambulatory Visit: Payer: Self-pay

## 2021-01-16 ENCOUNTER — Encounter (HOSPITAL_COMMUNITY): Payer: Self-pay

## 2021-01-16 ENCOUNTER — Encounter (HOSPITAL_COMMUNITY)
Admission: RE | Admit: 2021-01-16 | Discharge: 2021-01-16 | Disposition: A | Discharge status: Home / Self Care | Payer: Medicare Other | Source: Ambulatory Visit | Attending: Urology | Admitting: Urology

## 2021-01-16 DIAGNOSIS — Z01818 Encounter for other preprocedural examination: Secondary | ICD-10-CM | POA: Diagnosis not present

## 2021-01-16 DIAGNOSIS — D649 Anemia, unspecified: Secondary | ICD-10-CM | POA: Diagnosis not present

## 2021-01-16 DIAGNOSIS — R7301 Impaired fasting glucose: Secondary | ICD-10-CM | POA: Diagnosis not present

## 2021-01-16 LAB — BASIC METABOLIC PANEL WITH GFR
Anion gap: 3 — ABNORMAL LOW (ref 5–15)
BUN: 30 mg/dL — ABNORMAL HIGH (ref 8–23)
CO2: 25 mmol/L (ref 22–32)
Calcium: 8.6 mg/dL — ABNORMAL LOW (ref 8.9–10.3)
Chloride: 111 mmol/L (ref 98–111)
Creatinine, Ser: 1.04 mg/dL — ABNORMAL HIGH (ref 0.44–1.00)
GFR, Estimated: 54 mL/min — ABNORMAL LOW
Glucose, Bld: 77 mg/dL (ref 70–99)
Potassium: 4.2 mmol/L (ref 3.5–5.1)
Sodium: 139 mmol/L (ref 135–145)

## 2021-01-16 LAB — CBC
HCT: 36.9 % (ref 36.0–46.0)
Hemoglobin: 11.2 g/dL — ABNORMAL LOW (ref 12.0–15.0)
MCH: 20.2 pg — ABNORMAL LOW (ref 26.0–34.0)
MCHC: 30.4 g/dL (ref 30.0–36.0)
MCV: 66.5 fL — ABNORMAL LOW (ref 80.0–100.0)
Platelets: 178 10*3/uL (ref 150–400)
RBC: 5.55 MIL/uL — ABNORMAL HIGH (ref 3.87–5.11)
RDW: 18.9 % — ABNORMAL HIGH (ref 11.5–15.5)
WBC: 5.7 10*3/uL (ref 4.0–10.5)
nRBC: 0 % (ref 0.0–0.2)

## 2021-01-16 NOTE — Telephone Encounter (Signed)
Pt called in and left a message. She stated she missed a call from [place].

## 2021-01-16 NOTE — Telephone Encounter (Signed)
I'm sorry she is so nervous.  She will need to call the neurosurgery clinic at [facility] and I'm sure that they can find folks for her to talk with that have had the procedure and give her information.  That is their job to help her before surgery.

## 2021-01-16 NOTE — Telephone Encounter (Signed)
Pt agreeable to plan of care for needing an appt for pre op clearance. Pt scheduled to see Melina Copa, [facility] 01/17/21 @ 8:25 am at the [facility] location. Pt thanked me for the call. Pt had EKG done today at the hospital, EKG is in epic. I assured the pt that I will send a message to Dr. Gloriann Loan that she has an appt tomorrow for pre op clearance.

## 2021-01-16 NOTE — Telephone Encounter (Signed)
Patient called to report to Dr. Carles Collet she was going to have an ultrasound procedure on her brain.   She said she is having it at [facility] and wants to give an update.   She wants to speak with Dr. Carles Collet directly, if possible.  Patient expressed frustration that nobody has called her back yet.

## 2021-01-16 NOTE — Progress Notes (Addendum)
COVID test- NA  PCP - Dr. Johny Sax Cardiologist - Dr. Liane Comber  Chest x-ray - no EKG - 01/16/21 Stress Test - no ECHO - 01/01/21-epic Cardiac Cath - no Pacemaker/ICD device last checked:NA  Sleep Study - no CPAP -   Fasting Blood Sugar - NA Checks Blood Sugar _____ times a day  Blood Thinner Instructions:NA Aspirin Instructions: Last Dose:  Anesthesia review: yes  Patient denies shortness of breath, fever, cough and chest pain at PAT appointment Pt reports that she doesn't have SOB climbing stairs or with any activities. She has a tremor and difficulty talking. She has moderate aortic stenosis and heart murmur.  Patient verbalized understanding of instructions that were given to them at the PAT appointment. Patient was also instructed that they will need to review over the PAT instructions again at home before surgery. Pt missed her PAT visit. Interview was done by phone and she had labs done later. Pt told to call her cardiologist about cardiac clearance and to fax it to [facility]. Instructions reviewed face to face at lab draw. EKG on chart.

## 2021-01-16 NOTE — Telephone Encounter (Signed)
Spoke to patient she is quite nervous and is looking for some reassurance about the procedure. She is wanting to know if any patients here have had  the procedure and what the out come has been for them. She wanted to know if there was anyone she could speak to that has had this done before. I told her I would reach out and see if anyone had any insight on this topic for her.

## 2021-01-17 ENCOUNTER — Telehealth: Payer: Self-pay | Admitting: Physician Assistant

## 2021-01-17 ENCOUNTER — Encounter: Payer: Self-pay | Admitting: Physician Assistant

## 2021-01-17 ENCOUNTER — Ambulatory Visit: Payer: Medicare Other | Admitting: Physician Assistant

## 2021-01-17 VITALS — BP 140/70 | HR 68 | Ht 61.0 in | Wt 127.0 lb

## 2021-01-17 DIAGNOSIS — I351 Nonrheumatic aortic (valve) insufficiency: Secondary | ICD-10-CM

## 2021-01-17 DIAGNOSIS — I10 Essential (primary) hypertension: Secondary | ICD-10-CM

## 2021-01-17 DIAGNOSIS — Z01810 Encounter for preprocedural cardiovascular examination: Secondary | ICD-10-CM

## 2021-01-17 DIAGNOSIS — I350 Nonrheumatic aortic (valve) stenosis: Secondary | ICD-10-CM

## 2021-01-17 NOTE — Telephone Encounter (Signed)
Please let Alexis Brady know one additional recommendation from today's OV that I did not mention - although not a cardiac issue, I saw that naproxen and ibuprofen are both listed on her med list. Neither are at maximum doses but patients do not typically take both of these at the same time because they're in the same class. Would advise she discuss with PCP.

## 2021-01-17 NOTE — Telephone Encounter (Signed)
Pt called back in and left a message returning our call

## 2021-01-17 NOTE — Telephone Encounter (Signed)
No answer at 8:27am

## 2021-01-17 NOTE — Addendum Note (Signed)
Addended by: Gaetano Net on: 01/17/2021 09:39 AM   Modules accepted: Orders

## 2021-01-17 NOTE — Patient Instructions (Signed)
Medication Instructions:  Your physician recommends that you continue on your current medications as directed. Please refer to the Current Medication list given to you today.  *If you need a refill on your cardiac medications before your next appointment, please call your pharmacy*   Lab Work: None ordered  If you have labs (blood work) drawn today and your tests are completely normal, you will receive your results only by: [place] (if you have MyChart) OR A paper copy in the mail If you have any lab test that is abnormal or we need to change your treatment, we will call you to review the results.   Testing/Procedures: Your physician has requested that you have an echocardiogram IN 6 MONTHS. 3 Echocardiography is a painless test that uses sound waves to create images of your heart. It provides your doctor with information about the size and shape of your heart and how well your heart's chambers and valves are working. This procedure takes approximately one hour. There are no restrictions for this procedure.    Follow-Up: At [facility], you and your health needs are our priority.  As part of our continuing mission to provide you with exceptional heart care, we have created designated Provider Care Teams.  These Care Teams include your primary Cardiologist (physician) and Advanced Practice Providers (APPs -  Physician Assistants and Nurse Practitioners) who all work together to provide you with the care you need, when you need it.  We recommend signing up for the patient portal called "MyChart".  Sign up information is provided on this After Visit Summary.  MyChart is used to connect with patients for Virtual Visits (Telemedicine).  Patients are able to view lab/test results, encounter notes, upcoming appointments, etc.  Non-urgent messages can be sent to your provider as well.   To learn more about what you can do with MyChart, go to [email or web address].    Your next  appointment:   6 month(s)  The format for your next appointment:   In Person  Provider:   Gwyndolyn Kaufman, MD  TO ESTABLISH CARE    Other Instructions  Echocardiogram An echocardiogram is a test that uses sound waves (ultrasound) to produce images of the heart. Images from an echocardiogram can provide important information about: Heart size and shape. The size and thickness and movement of your heart's walls. Heart muscle function and strength. Heart valve function or if you have stenosis. Stenosis is when the heart valves are too narrow. If blood is flowing backward through the heart valves (regurgitation). A tumor or infectious growth around the heart valves. Areas of heart muscle that are not working well because of poor blood flow or injury from a heart attack. Aneurysm detection. An aneurysm is a weak or damaged part of an artery wall. The wall bulges out from the normal force of blood pumping through the body. Tell a health care provider about: Any allergies you have. All medicines you are taking, including vitamins, herbs, eye drops, creams, and over-the-counter medicines. Any blood disorders you have. Any surgeries you have had. Any medical conditions you have. Whether you are pregnant or may be pregnant. What are the risks? Generally, this is a safe test. However, problems may occur, including an allergic reaction to dye (contrast) that may be used during the test. What happens before the test? No specific preparation is needed. You may eat and drink normally. What happens during the test?  You will take off your clothes from the waist up and put  on a hospital gown. Electrodes or electrocardiogram (ECG)patches may be placed on your chest. The electrodes or patches are then connected to a device that monitors your heart rate and rhythm. You will lie down on a table for an ultrasound exam. A gel will be applied to your chest to help sound waves pass through your  skin. A handheld device, called a transducer, will be pressed against your chest and moved over your heart. The transducer produces sound waves that travel to your heart and bounce back (or "echo" back) to the transducer. These sound waves will be captured in real-time and changed into images of your heart that can be viewed on a video monitor. The images will be recorded on a computer and reviewed by your health care provider. You may be asked to change positions or hold your breath for a short time. This makes it easier to get different views or better views of your heart. In some cases, you may receive contrast through an IV in one of your veins. This can improve the quality of the pictures from your heart. The procedure may vary among health care providers and hospitals. What can I expect after the test? You may return to your normal, everyday life, including diet, activities, and medicines, unless your health care provider tells you not to do that. Follow these instructions at home: It is up to you to get the results of your test. Ask your health care provider, or the department that is doing the test, when your results will be ready. Keep all follow-up visits. This is important. Summary An echocardiogram is a test that uses sound waves (ultrasound) to produce images of the heart. Images from an echocardiogram can provide important information about the size and shape of your heart, heart muscle function, heart valve function, and other possible heart problems. You do not need to do anything to prepare before this test. You may eat and drink normally. After the echocardiogram is completed, you may return to your normal, everyday life, unless your health care provider tells you not to do that. This information is not intended to replace advice given to you by your health care provider. Make sure you discuss any questions you have with your health care provider. Document Revised: 10/31/2019  Document Reviewed: 10/31/2019 Elsevier Patient Education  2022 [employer].

## 2021-01-17 NOTE — Progress Notes (Addendum)
Cardiology Office Note    Date:  01/17/2021   ID:  Alexis Brady, DOB 11/13/1940, MRN [id number]  PCP:  Ginger Organ., MD  Cardiologist:  Ena Dawley, MD (Inactive)  Electrophysiologist:  None   Chief Complaint: pre-op clearance  History of Present Illness:   Alexis Brady is a 80 y.o. female with history of aortic stenosis, anemia, asthma, depression, restrictive lung disease, ulcerative colitis, spasmodic dysphonia who presents for overdue follow-up and preoperative evaluation. She was previously followed by our office for aortic stenosis. Last OV was in 2020 via telemedicine. More recently her PCP ordered echo done 01/2021 showing EF 60-65%, grade 1 DD, mild MR, moderate-severe aortic stenosis, mild AI.  She is seeking clearance for ureteroscopy under general anesthesia next week. She affirms she has been doing great since last visit. She was previously very active going ziplining, skydiving and white water rafting a few years back. She remains active but has not done anything that extreme recently. She continues to do all her housework, yardwork, and climbs 3 flights of stairs when visiting her granddaughter at college. She is able to do so without any chest pain, dyspnea, or dizziness. No palpitations or syncope. The other day she decided not to drive to the drug store but walked all the way there and walk instead. Her BP is elevated today but she states she was somewhat upset about having to return for a visit after just being here for an echo earlier this month. She states BP is usually perfect per her report and was 122/70 when checked by pre-admissions testing.   Labwork independently reviewed: 10/22 Hgb 11.2, K 4.2, Cr 1.04 Lipids followed in primary care previously   Past Medical History:  Diagnosis Date   Anemia    Aortic valve stenosis 01/01/2021   Moderate to severe , noted on ECHO   Cardiac murmur 08/02/2009   Essential tremor 05/23/2013   BOTH  HANDS AND IN VOICE   Heart murmur    History of kidney stones    Hyperlipidemia 08/12/2012   Impaired fasting glucose 08/12/2012   Laryngeal spasm    Osteopenia    Osteoporosis    Ulcerative colitis [facility])     Past Surgical History:  Procedure Laterality Date   COLONOSCOPY     SEVERAL YRS AGO PER PT ON 12-17-2020   EXTRACORPOREAL SHOCK WAVE LITHOTRIPSY Left 11/11/2020   Procedure: LEFT EXTRACORPOREAL SHOCK WAVE LITHOTRIPSY (ESWL);  Surgeon: Irine Seal, MD;  Location: [facility];  Service: Urology;  Laterality: Left;   VOCAL CORD INJECTION     YRS AGO PER PT X 1 ON 12-17-2020    Current Medications: Current Meds  Medication Sig   Calcium Carb-Cholecalciferol (CALCIUM 600 + D PO) Take 1 tablet by mouth 2 (two) times daily.   Cholecalciferol (VITAMIN D) 50 MCG (2000 UT) tablet Take 2,000 Units by mouth daily.   Coenzyme Q10 300 MG CAPS Take 300 mg by mouth daily.   Cyanocobalamin (B-12) 5000 MCG CAPS Take 5,000 mcg by mouth daily.   ibuprofen (ADVIL) 200 MG tablet Take 200 mg by mouth 2 (two) times daily.   mesalamine (LIALDA) 1.2 g EC tablet TAKE 2 TABLETS BY MOUTH DAILY WITH BREAKFAST   naproxen sodium (ALEVE) 220 MG tablet Take 220 mg by mouth in the morning and at bedtime.   primidone (MYSOLINE) 50 MG tablet Take 2 tablets (100 mg total) by mouth 2 (two) times daily.   topiramate (TOPAMAX) 100 MG tablet TAKE  1 TABLET(100 MG) BY MOUTH DAILY   zinc gluconate 50 MG tablet Take 50 mg by mouth daily.      Allergies:   Patient has no known allergies.   Social History   Socioeconomic History   Marital status: Married    Spouse name: Not on file   Number of children: 2   Years of education: two years college   Highest education level: Not on file  Occupational History   Occupation: Retired  Tobacco Use   Smoking status: Former    Types: Cigarettes    Quit date: 05/16/1979    Years since quitting: 41.7   Smokeless tobacco: Never   Tobacco comments:     Social smoking only for a few years  Vaping Use   Vaping Use: Never used  Substance and Sexual Activity   Alcohol use: Yes    Comment: rare wine   Drug use: Never   Sexual activity: Not on file  Other Topics Concern   Not on file  Social History Narrative   Lives at home alone.   No caffeine use.   Right-handed.   Social Determinants of Health   Financial Resource Strain: Not on file  Food Insecurity: Not on file  Transportation Needs: Not on file  Physical Activity: Not on file  Stress: Not on file  Social Connections: Not on file     Family History:  The patient's family history includes Arrhythmia in her sister; Breast cancer in her cousin and maternal aunt; CVA in her mother; Diabetes in her sister; Fibromyalgia in her sister and sister; Heart attack in her father; Heart failure in her father; Hypertension in her sister and sister. There is no history of Colon cancer, Esophageal cancer, Rectal cancer, or Stomach cancer.  ROS:   Please see the history of present illness.  All other systems are reviewed and otherwise negative.    EKGs/Labs/Other Studies Reviewed:    Studies reviewed are outlined and summarized above. Reports included below if pertinent.  2D echo 01/01/21   1. Left ventricular ejection fraction, by estimation, is 60 to 65%. The  left ventricle has normal function. The left ventricle has no regional  wall motion abnormalities. Left ventricular diastolic parameters are  consistent with Grade I diastolic  dysfunction (impaired relaxation).   2. Right ventricular systolic function is normal. The right ventricular  size is normal.   3. Left atrial size was mildly dilated.   4. The mitral valve is normal in structure. Mild mitral valve  regurgitation. No evidence of mitral stenosis.   5. The aortic valve is tricuspid. There is severe calcifcation of the  aortic valve. Aortic valve regurgitation is mild. Moderate to severe  aortic valve stenosis. Aortic  regurgitation PHT measures 997 msec. Aortic  valve area, by VTI measures 0.71 cm.  Aortic valve mean gradient measures 30.0 mmHg. Aortic valve Vmax measures  3.63 m/s.   6. The inferior vena cava is normal in size with greater than 50%  respiratory variability, suggesting right atrial pressure of 3 mmHg.     EKG:  EKG is ordered today, personally reviewed, demonstrating NSR 68bpm, no acute STT changes from prior Yesterday's EKG reviewed, poor quality due to baseline wander  Recent Labs: 01/16/2021: BUN 30; Creatinine, Ser 1.04; Hemoglobin 11.2; Platelets 178; Potassium 4.2; Sodium 139  Recent Lipid Panel No results found for: CHOL, TRIG, HDL, CHOLHDL, VLDL, LDLCALC, LDLDIRECT  PHYSICAL EXAM:    VS:  BP 140/70   Pulse 68  Ht 5' 1"  (1.549 m)   Wt 127 lb (57.6 kg)   SpO2 100%   BMI 24.00 kg/m   BMI: Body mass index is 24 kg/m.  GEN: Well nourished, well developed female in no acute distress HEENT: normocephalic, atraumatic, spasmodic voice noted Neck: no JVD, carotid bruits, or masses Cardiac: RRR; 2/6 SEM RUSB with preserved S2, no rubs or gallops, no edema  Respiratory:  clear to auscultation bilaterally, normal work of breathing GI: soft, nontender, nondistended, + BS MS: no deformity or atrophy Skin: warm and dry, no rash Neuro:  Alert and Oriented x 3, Strength and sensation are intact, follows commands Psych: euthymic mood, full affect  Wt Readings from Last 3 Encounters:  01/17/21 127 lb (57.6 kg)  01/16/21 125 lb (56.7 kg)  11/11/20 122 lb (55.3 kg)     ASSESSMENT & PLAN:   1. Preoperative evaluation - recent echo demonstrated moderate-severe aortic stenosis (previously moderate in 2020). I reviewed clinical situation with Dr. Harrington Challenger, our doctor of the day, since Dr. Meda Coffee has relocated. The patient is able to complete 6.61 METS without any angina or dyspnea. She is asymptomatic from her aortic stenosis at this time. Per discussion with MD, the patient would be at  acceptable risk for the planned procedure without further cardiovascular testing. Given her valvular disease we would recommend close hemodynamic monitoring during anesthesia and surgery and follow for any signs of congestive heart failure. She is euvolemic on exam today. I will route this clearance note to Dr. Gloriann Loan with urology upon signing via Epic and fax.  2. Aortic stenosis, moderate-severe - asymptomatic at this time. Discussed observation/notification for warning symptoms. We will plan for a repeat echocardiogram in 6 months and follow-up with Dr. Johney Frame to establish care. In the future if she were to require surgery she would be a great TAVR candidate.  3. Aortic insufficiency, mild - will continue to follow by echocardiogram as above. Not of any hemodynamic consequence at this time.  4. Essential HTN - BP suboptimally controlled initially but recheck by me 132/72 and coming down. She states home BPs are usually completely normal which correlates with last BP at outside office. No changes made today.  Disposition: F/u with Dr. Johney Frame to establish care in 6 months.   Medication Adjustments/Labs and Tests Ordered: Current medicines are reviewed at length with the patient today.  Concerns regarding medicines are outlined above. Medication changes, Labs and Tests ordered today are summarized above and listed in the Patient Instructions accessible in Encounters.   Signed, Charlie Pitter, PA-C  01/17/2021 8:39 AM    [address], [place], [place]  [id number] Phone: [phone]; Fax: [phone]

## 2021-01-17 NOTE — Telephone Encounter (Signed)
Call placed to pt regarding phone note below.  Per pt, she doesn't take Ibuprofen, only Naproxen when she needs it. Medication list has been updated.

## 2021-01-20 NOTE — Progress Notes (Signed)
Anesthesia Chart Review   Case: [id number] Date/Time: 01/22/21 1015   Procedure: CYSTOSCOPY LEFT URETEROSCOPY/HOLMIUM LASER/STENT PLACEMENT (Left)   Anesthesia type: General   Pre-op diagnosis: LEFT URETERAL STONE   Location: [facility] / [facility]   Surgeons: Lucas Mallow, MD       DISCUSSION:80 y.o. former smoker with h/o ulcerative colitis, moderate to severe Aortic Stenosis (valve area 0.71 cm 2, mean gradient 30 mmHg on Echo 01/01/2021), essential tremor, left ureteral stone scheduled for above procedure 01/22/21 with Dr. Link Snuffer.   Pt seen by cardiology 01/17/2021. Per OV note, "Preoperative evaluation - recent echo demonstrated moderate-severe aortic stenosis (previously moderate in 2020). I reviewed clinical situation with Dr. Harrington Challenger, our doctor of the day, since Dr. Meda Coffee has relocated. The patient is able to complete 6.61 METS without any angina or dyspnea. She is asymptomatic from her aortic stenosis at this time. Per discussion with MD, the patient would be at acceptable risk for the planned procedure without further cardiovascular testing. Given her valvular disease we would recommend close hemodynamic monitoring during anesthesia and surgery and follow for any signs of congestive heart failure. She is euvolemic on exam today. I will route this clearance note to Dr. Gloriann Loan with urology upon signing via [place] and fax."  Anticipate pt can proceed with planned procedure barring acute status change.   VS: BP 122/70   Pulse 67   Temp 36.4 C (Oral)   Resp 20   Ht 5' 1"  (1.549 m)   Wt 56.7 kg   SpO2 97%   BMI 23.62 kg/m   PROVIDERS: Ginger Organ., MD is PCP   Gwyndolyn Kaufman, MD is Cardiologist  LABS: Labs reviewed: Acceptable for surgery. (all labs ordered are listed, but only abnormal results are displayed)  Labs Reviewed  BASIC METABOLIC PANEL - Abnormal; Notable for the following components:      Result Value   BUN 30 (*)    Creatinine, Ser 1.04 (*)     Calcium 8.6 (*)    GFR, Estimated 54 (*)    Anion gap 3 (*)    All other components within normal limits  CBC - Abnormal; Notable for the following components:   RBC 5.55 (*)    Hemoglobin 11.2 (*)    MCV 66.5 (*)    MCH 20.2 (*)    RDW 18.9 (*)    All other components within normal limits     IMAGES:   EKG: 01/16/2021 Rate 59 bpm  Sinus bradycardia with sinus arrhythmia Septal infarct, age undetermined Abnormal ECG  CV: Echo 01/01/2021  1. Left ventricular ejection fraction, by estimation, is 60 to 65%. The  left ventricle has normal function. The left ventricle has no regional  wall motion abnormalities. Left ventricular diastolic parameters are  consistent with Grade I diastolic  dysfunction (impaired relaxation).   2. Right ventricular systolic function is normal. The right ventricular  size is normal.   3. Left atrial size was mildly dilated.   4. The mitral valve is normal in structure. Mild mitral valve  regurgitation. No evidence of mitral stenosis.   5. The aortic valve is tricuspid. There is severe calcifcation of the  aortic valve. Aortic valve regurgitation is mild. Moderate to severe  aortic valve stenosis. Aortic regurgitation PHT measures 997 msec. Aortic  valve area, by VTI measures 0.71 cm.  Aortic valve mean gradient measures 30.0 mmHg. Aortic valve Vmax measures  3.63 m/s.   6. The inferior vena  cava is normal in size with greater than 50%  respiratory variability, suggesting right atrial pressure of 3 mmHg. Past Medical History:  Diagnosis Date   Anemia    Aortic valve stenosis 01/01/2021   Moderate to severe , noted on ECHO   Cardiac murmur 08/02/2009   Essential tremor 05/23/2013   BOTH HANDS AND IN VOICE   Heart murmur    History of kidney stones    Hyperlipidemia 08/12/2012   Impaired fasting glucose 08/12/2012   Laryngeal spasm    Osteopenia    Osteoporosis    Ulcerative colitis [facility])     Past Surgical History:  Procedure  Laterality Date   COLONOSCOPY     SEVERAL YRS AGO PER PT ON 12-17-2020   EXTRACORPOREAL SHOCK WAVE LITHOTRIPSY Left 11/11/2020   Procedure: LEFT EXTRACORPOREAL SHOCK WAVE LITHOTRIPSY (ESWL);  Surgeon: Irine Seal, MD;  Location: [facility];  Service: Urology;  Laterality: Left;   VOCAL CORD INJECTION     YRS AGO PER PT X 1 ON 12-17-2020    MEDICATIONS:  Calcium Carb-Cholecalciferol (CALCIUM 600 + D PO)   Cholecalciferol (VITAMIN D) 50 MCG (2000 UT) tablet   Coenzyme Q10 300 MG CAPS   Cyanocobalamin (B-12) 5000 MCG CAPS   mesalamine (LIALDA) 1.2 g EC tablet   naproxen sodium (ALEVE) 220 MG tablet   primidone (MYSOLINE) 50 MG tablet   topiramate (TOPAMAX) 100 MG tablet   zinc gluconate 50 MG tablet   No current facility-administered medications for this encounter.    Konrad Felix Ward, PA-C [facility] [phone]

## 2021-01-20 NOTE — Anesthesia Preprocedure Evaluation (Addendum)
Anesthesia Evaluation  Patient identified by MRN, date of birth, ID band Patient awake    Reviewed: Allergy & Precautions, NPO status , Patient's Chart, lab work & pertinent test results  Airway Mallampati: I  TM Distance: >3 FB Neck ROM: Full    Dental no notable dental hx. (+) Teeth Intact, Dental Advisory Given   Pulmonary asthma , former smoker,    Pulmonary exam normal breath sounds clear to auscultation       Cardiovascular Normal cardiovascular exam+ Valvular Problems/Murmurs AS  Rhythm:Regular Rate:Normal  EKG: 01/16/2021 Rate 59 bpm  Sinus bradycardia with sinus arrhythmia Septal infarct, age undetermined Abnormal ECG  CV: Echo 01/01/2021 1. Left ventricular ejection fraction, by estimation, is 60 to 65%. The  left ventricle has normal function. The left ventricle has no regional  wall motion abnormalities. Left ventricular diastolic parameters are  consistent with Grade I diastolic  dysfunction (impaired relaxation).  2. Right ventricular systolic function is normal. The right ventricular  size is normal.  3. Left atrial size was mildly dilated.  4. The mitral valve is normal in structure. Mild mitral valve  regurgitation. No evidence of mitral stenosis.  5. The aortic valve is tricuspid. There is severe calcifcation of the  aortic valve. Aortic valve regurgitation is mild. Moderate to severe  aortic valve stenosis. Aortic regurgitation PHT measures 997 msec. Aortic  valve area, by VTI measures 0.71 cm.  Aortic valve mean gradient measures 30.0 mmHg. Aortic valve Vmax measures  3.63 m/s.  6. The inferior vena cava is normal in size with greater than 50%  respiratory variability, suggesting right atrial pressure of 3 mmHg.   Neuro/Psych Essential tremor negative neurological ROS  negative psych ROS   GI/Hepatic Neg liver ROS, PUD, UC   Endo/Other  negative endocrine ROS  Renal/GU negative Renal  ROS  negative genitourinary   Musculoskeletal negative musculoskeletal ROS (+)   Abdominal   Peds  Hematology negative hematology ROS (+)   Anesthesia Other Findings   Reproductive/Obstetrics                           Anesthesia Physical Anesthesia Plan  ASA: 3  Anesthesia Plan: General   Post-op Pain Management:    Induction: Intravenous  PONV Risk Score and Plan: Ondansetron, Dexamethasone and Treatment may vary due to age or medical condition  Airway Management Planned: LMA  Additional Equipment:   Intra-op Plan:   Post-operative Plan: Extubation in OR  Informed Consent: I have reviewed the patients History and Physical, chart, labs and discussed the procedure including the risks, benefits and alternatives for the proposed anesthesia with the patient or authorized representative who has indicated his/her understanding and acceptance.     Dental advisory given  Plan Discussed with: CRNA  Anesthesia Plan Comments:       Anesthesia Quick Evaluation

## 2021-01-22 ENCOUNTER — Encounter (HOSPITAL_COMMUNITY)
Admission: RE | Disposition: A | Discharge status: Home / Self Care | Payer: Self-pay | Source: Home / Self Care | Attending: Urology

## 2021-01-22 ENCOUNTER — Ambulatory Visit (HOSPITAL_COMMUNITY)
Admission: RE | Admit: 2021-01-22 | Discharge: 2021-01-22 | Disposition: A | Discharge status: Home / Self Care | Payer: Medicare Other | Attending: Urology | Admitting: Urology

## 2021-01-22 ENCOUNTER — Ambulatory Visit (HOSPITAL_COMMUNITY): Payer: Medicare Other

## 2021-01-22 ENCOUNTER — Ambulatory Visit (HOSPITAL_COMMUNITY): Payer: Medicare Other | Admitting: Anesthesiology

## 2021-01-22 ENCOUNTER — Encounter (HOSPITAL_COMMUNITY): Payer: Self-pay | Admitting: Urology

## 2021-01-22 DIAGNOSIS — Z87891 Personal history of nicotine dependence: Secondary | ICD-10-CM | POA: Insufficient documentation

## 2021-01-22 DIAGNOSIS — D649 Anemia, unspecified: Secondary | ICD-10-CM

## 2021-01-22 DIAGNOSIS — R7301 Impaired fasting glucose: Secondary | ICD-10-CM

## 2021-01-22 DIAGNOSIS — N200 Calculus of kidney: Secondary | ICD-10-CM | POA: Diagnosis present

## 2021-01-22 DIAGNOSIS — Z79899 Other long term (current) drug therapy: Secondary | ICD-10-CM | POA: Diagnosis not present

## 2021-01-22 HISTORY — DX: Nonrheumatic aortic (valve) stenosis: I35.0

## 2021-01-22 HISTORY — PX: CYSTOSCOPY/URETEROSCOPY/HOLMIUM LASER/STENT PLACEMENT: SHX6546

## 2021-01-22 HISTORY — DX: Personal history of urinary calculi: Z87.442

## 2021-01-22 SURGERY — CYSTOSCOPY/URETEROSCOPY/HOLMIUM LASER/STENT PLACEMENT
Anesthesia: General | Site: Ureter | Laterality: Left

## 2021-01-22 MED ORDER — CHLORHEXIDINE GLUCONATE 0.12 % MT SOLN
15.0000 mL | Freq: Once | OROMUCOSAL | Status: AC
  Administered 2021-01-22: 15 mL via OROMUCOSAL

## 2021-01-22 MED ORDER — FENTANYL CITRATE (PF) 250 MCG/5ML IJ SOLN
INTRAMUSCULAR | Status: DC | PRN
  Administered 2021-01-22 (×4): 25 ug via INTRAVENOUS

## 2021-01-22 MED ORDER — PROPOFOL 10 MG/ML IV BOLUS
INTRAVENOUS | Status: DC | PRN
  Administered 2021-01-22: 30 mg via INTRAVENOUS

## 2021-01-22 MED ORDER — ONDANSETRON HCL 4 MG/2ML IJ SOLN
INTRAMUSCULAR | Status: DC | PRN
  Administered 2021-01-22: 4 mg via INTRAVENOUS

## 2021-01-22 MED ORDER — LACTATED RINGERS IV SOLN: INTRAVENOUS | Status: DC

## 2021-01-22 MED ORDER — ACETAMINOPHEN 500 MG PO TABS
1000.0000 mg | ORAL_TABLET | Freq: Once | ORAL | Status: AC
  Administered 2021-01-22: 1000 mg via ORAL
  Filled 2021-01-22: qty 2

## 2021-01-22 MED ORDER — FENTANYL CITRATE (PF) 250 MCG/5ML IJ SOLN
INTRAMUSCULAR | Status: AC
  Filled 2021-01-22: qty 5

## 2021-01-22 MED ORDER — ETOMIDATE 2 MG/ML IV SOLN
INTRAVENOUS | Status: DC | PRN
  Administered 2021-01-22: 6 mg via INTRAVENOUS

## 2021-01-22 MED ORDER — LIDOCAINE 2% (20 MG/ML) 5 ML SYRINGE
INTRAMUSCULAR | Status: DC | PRN
  Administered 2021-01-22: 40 mg via INTRAVENOUS

## 2021-01-22 MED ORDER — ORAL CARE MOUTH RINSE: 15.0000 mL | Freq: Once | OROMUCOSAL | Status: AC

## 2021-01-22 MED ORDER — PHENYLEPHRINE HCL (PRESSORS) 10 MG/ML IV SOLN
INTRAVENOUS | Status: AC
  Filled 2021-01-22: qty 1

## 2021-01-22 MED ORDER — PHENYLEPHRINE HCL-NACL 20-0.9 MG/250ML-% IV SOLN
INTRAVENOUS | Status: DC | PRN
  Administered 2021-01-22: 25 ug/min via INTRAVENOUS

## 2021-01-22 MED ORDER — HYDROCODONE-ACETAMINOPHEN 5-325 MG PO TABS
1.0000 | ORAL_TABLET | ORAL | 0 refills | Status: AC | PRN
Start: 2021-01-22 — End: ?

## 2021-01-22 MED ORDER — IOHEXOL 300 MG/ML  SOLN
INTRAMUSCULAR | Status: DC | PRN
  Administered 2021-01-22: 20 mL

## 2021-01-22 MED ORDER — PROPOFOL 10 MG/ML IV BOLUS
INTRAVENOUS | Status: AC
  Filled 2021-01-22: qty 20

## 2021-01-22 MED ORDER — MIDAZOLAM HCL 2 MG/2ML IJ SOLN
INTRAMUSCULAR | Status: AC
  Filled 2021-01-22: qty 2

## 2021-01-22 MED ORDER — SODIUM CHLORIDE 0.9 % IR SOLN
Status: DC | PRN
  Administered 2021-01-22: 3000 mL

## 2021-01-22 MED ORDER — CEFAZOLIN SODIUM-DEXTROSE 2-4 GM/100ML-% IV SOLN
2.0000 g | INTRAVENOUS | Status: AC
  Administered 2021-01-22: 2 g via INTRAVENOUS
  Filled 2021-01-22: qty 100

## 2021-01-22 MED ORDER — PHENYLEPHRINE 40 MCG/ML (10ML) SYRINGE FOR IV PUSH (FOR BLOOD PRESSURE SUPPORT)
PREFILLED_SYRINGE | INTRAVENOUS | Status: DC | PRN
  Administered 2021-01-22: 120 ug via INTRAVENOUS

## 2021-01-22 MED ORDER — DEXAMETHASONE SODIUM PHOSPHATE 10 MG/ML IJ SOLN
INTRAMUSCULAR | Status: DC | PRN
  Administered 2021-01-22: 4 mg via INTRAVENOUS

## 2021-01-22 MED ORDER — FENTANYL CITRATE PF 50 MCG/ML IJ SOSY: 25.0000 ug | PREFILLED_SYRINGE | INTRAMUSCULAR | Status: DC | PRN

## 2021-01-22 SURGICAL SUPPLY — 23 items
BAG URO CATCHER STRL LF (MISCELLANEOUS) ×2 IMPLANT
BASKET LASER NITINOL 1.9FR (BASKET) IMPLANT
BASKET ZERO TIP NITINOL 2.4FR (BASKET) IMPLANT
BSKT STON RTRVL 120 1.9FR (BASKET)
BSKT STON RTRVL ZERO TP 2.4FR (BASKET)
CATH INTERMIT  6FR 70CM (CATHETERS) ×2 IMPLANT
CLOTH BEACON ORANGE TIMEOUT ST (SAFETY) ×2 IMPLANT
EXTRACTOR STONE 1.7FRX115CM (UROLOGICAL SUPPLIES) IMPLANT
GLOVE SURG ENC MOIS LTX SZ7.5 (GLOVE) ×2 IMPLANT
GOWN STRL REUS W/TWL XL LVL3 (GOWN DISPOSABLE) ×2 IMPLANT
GUIDEWIRE ANG ZIPWIRE 038X150 (WIRE) ×1 IMPLANT
GUIDEWIRE STR DUAL SENSOR (WIRE) ×2 IMPLANT
KIT TURNOVER KIT A (KITS) IMPLANT
LASER FIB FLEXIVA PULSE ID 365 (Laser) IMPLANT
MANIFOLD NEPTUNE II (INSTRUMENTS) ×2 IMPLANT
PACK CYSTO (CUSTOM PROCEDURE TRAY) ×2 IMPLANT
SHEATH URETERAL 12FRX28CM (UROLOGICAL SUPPLIES) IMPLANT
SHEATH URETERAL 12FRX35CM (MISCELLANEOUS) ×1 IMPLANT
STENT URET 6FRX24 CONTOUR (STENTS) ×1 IMPLANT
TRACTIP FLEXIVA PULS ID 200XHI (Laser) IMPLANT
TRACTIP FLEXIVA PULSE ID 200 (Laser)
TUBING CONNECTING 10 (TUBING) ×2 IMPLANT
TUBING UROLOGY SET (TUBING) ×2 IMPLANT

## 2021-01-22 NOTE — Anesthesia Procedure Notes (Addendum)
Procedure Name: LMA Insertion Date/Time: 01/22/2021 10:43 AM Performed by: Cynda Familia, CRNA Pre-anesthesia Checklist: Patient identified, Emergency Drugs available, Suction available and Patient being monitored Patient Re-evaluated:Patient Re-evaluated prior to induction Oxygen Delivery Method: Circle System Utilized Preoxygenation: Pre-oxygenation with 100% oxygen Induction Type: IV induction Ventilation: Mask ventilation without difficulty LMA: LMA inserted and LMA with gastric port inserted LMA Size: 3.0 Number of attempts: 1 Placement Confirmation: positive ETCO2 Tube secured with: Tape Dental Injury: Teeth and Oropharynx as per pre-operative assessment  Comments: Smooth iv induction woodrum- lma inserted am crna atraumatic- teeth and mouth as preop- bilt bs

## 2021-01-22 NOTE — Anesthesia Procedure Notes (Signed)
Date/Time: 01/22/2021 11:57 AM Performed by: Cynda Familia, CRNA Oxygen Delivery Method: Simple face mask Placement Confirmation: positive ETCO2 and breath sounds checked- equal and bilateral Dental Injury: Teeth and Oropharynx as per pre-operative assessment

## 2021-01-22 NOTE — H&P (Signed)
H&P  Chief Complaint: Left ureteral calculus  History of Present Illness: 80 year old female with a left ureteral calculus status post ESWL with residual fragments presents for ureteroscopy.  Past Medical History:  Diagnosis Date   Anemia    Aortic valve stenosis 01/01/2021   Moderate to severe , noted on ECHO   Cardiac murmur 08/02/2009   Essential tremor 05/23/2013   BOTH HANDS AND IN VOICE   Heart murmur    History of kidney stones    Hyperlipidemia 08/12/2012   Impaired fasting glucose 08/12/2012   Laryngeal spasm    Osteopenia    Osteoporosis    Ulcerative colitis [facility])    Past Surgical History:  Procedure Laterality Date   COLONOSCOPY     SEVERAL YRS AGO PER PT ON 12-17-2020   EXTRACORPOREAL SHOCK WAVE LITHOTRIPSY Left 11/11/2020   Procedure: LEFT EXTRACORPOREAL SHOCK WAVE LITHOTRIPSY (ESWL);  Surgeon: Irine Seal, MD;  Location: [facility];  Service: Urology;  Laterality: Left;   VOCAL CORD INJECTION     YRS AGO PER PT X 1 ON 12-17-2020    Home Medications:  Medications Prior to Admission  Medication Sig Dispense Refill Last Dose   Calcium Carb-Cholecalciferol (CALCIUM 600 + D PO) Take 1 tablet by mouth 2 (two) times daily.   01/21/2021   Cholecalciferol (VITAMIN D) 50 MCG (2000 UT) tablet Take 2,000 Units by mouth daily.   01/21/2021   Coenzyme Q10 300 MG CAPS Take 300 mg by mouth daily.   01/21/2021   Cyanocobalamin (B-12) 5000 MCG CAPS Take 5,000 mcg by mouth daily.   01/21/2021   mesalamine (LIALDA) 1.2 g EC tablet TAKE 2 TABLETS BY MOUTH DAILY WITH BREAKFAST 60 tablet 11 01/22/2021 at 0645   naproxen sodium (ALEVE) 220 MG tablet Take 220 mg by mouth in the morning and at bedtime.   01/21/2021   primidone (MYSOLINE) 50 MG tablet Take 2 tablets (100 mg total) by mouth 2 (two) times daily. 360 tablet 1 01/22/2021 at 0645   topiramate (TOPAMAX) 100 MG tablet TAKE 1 TABLET(100 MG) BY MOUTH DAILY 90 tablet 2 01/22/2021 at 0645   zinc gluconate 50 MG tablet Take  50 mg by mouth daily.   01/21/2021   Allergies: No Known Allergies  Family History  Problem Relation Age of Onset   CVA Mother    Fibromyalgia Sister    Diabetes Sister    Hypertension Sister    Heart attack Father    Heart failure Father    Breast cancer Maternal Aunt    Breast cancer Cousin    Hypertension Sister    Fibromyalgia Sister    Arrhythmia Sister        defib   Colon cancer Neg Hx    Esophageal cancer Neg Hx    Rectal cancer Neg Hx    Stomach cancer Neg Hx    Social History:  reports that she quit smoking about 41 years ago. Her smoking use included cigarettes. She has never used smokeless tobacco. She reports current alcohol use. She reports that she does not use drugs.  ROS: A complete review of systems was performed.  All systems are negative except for pertinent findings as noted. ROS   Physical Exam:  Vital signs in last 24 hours: Temp:  [98.5 F (36.9 C)] 98.5 F (36.9 C) (11/02 0909) Pulse Rate:  [63] 63 (11/02 0909) Resp:  [18] 18 (11/02 0909) BP: (145)/(78) 145/78 (11/02 0909) SpO2:  [99 %] 99 % (11/02 0909) General:  Alert and oriented, No acute distress HEENT: Normocephalic, atraumatic Neck: No JVD or lymphadenopathy Cardiovascular: Regular rate and rhythm Lungs: Regular rate and effort Abdomen: Soft, nontender, nondistended, no abdominal masses Back: No CVA tenderness Extremities: No edema Neurologic: Grossly intact  Laboratory Data:  No results found for this or any previous visit (from the past 24 hour(s)). No results found for this or any previous visit (from the past 240 hour(s)). Creatinine: Recent Labs    01/16/21 0949  CREATININE 1.04*    Impression/Assessment:  Left ureteral calculus  Plan:  Proceed with left ureteroscopy with laser lithotripsy, ureteral stent placement  Marton Redwood, III 01/22/2021, 10:25 AM

## 2021-01-22 NOTE — Op Note (Signed)
Operative Note  Preoperative diagnosis:  1.  Left ureteral calculus  Postoperative diagnosis: 1.  Left ureteral calculus  Procedure(s): 1.  Cystoscopy with left retrograde pyelogram, left ureteroscopy with laser lithotripsy, ureteral stent placement  Surgeon: Link Snuffer, MD  Assistants: None  Anesthesia: General  Complications: None immediate  EBL: Minimal  Specimens: 1.  None  Drains/Catheters: 1.  6 x 24 double-J ureteral stent  Intraoperative findings: 1.  Moderate cystocele.  Bladder mucosa normal. 2.  Severely impacted left proximal ureteral calculus completely occluding the lumen.  Likely a chronic process. 3.  Retrograde pyelogram revealed a completely occluded ureter.  After fragmentation of the stone, it revealed a hydronephrotic kidney.  Indication: 80 year old female status post ESWL for a left ureteral calculus with persistent stone presents for ureteroscopy.  Description of procedure:  The patient was identified and consent was obtained.  The patient was taken to the operating room and placed in the supine position.  The patient was placed under general anesthesia.  Perioperative antibiotics were administered.  The patient was placed in dorsal lithotomy.  Patient was prepped and draped in a standard sterile fashion and a timeout was performed.  A 21 French rigid cystoscope was advanced into the urethra and into the bladder.  Complete cystoscopy was performed with no abnormal findings.  The left ureter was cannulated with a sensor wire which was advanced up to the radiopaque stone.  I was not able to pass a wire past this.  I tried a Secretary/administrator and this did not pass as well.  I advanced a semirigid ureteroscope alongside the wire.  The stone was too proximal for the semirigid ureteroscope to reach.  I shot a retrograde pyelogram through the scope and no contrast extended beyond the level of the stone.  I passed a second wire through the scope up to the kidney stone  and withdrew the scope.  A 12 x 14 ureteral access sheath was advanced over the wire under continuous fluoroscopic guidance just proximal to the stone.  The inner sheath and wire were withdrawn.  Digital ureteroscopy identified the stone of interest.  There was some inflammatory change around the stone and it was obviously impacted.  I laser fragmented the stone carefully and slowly given that there was not wire access to the kidney.  All of the stone was fragmented.  I was then able to identify a lumen and advanced a wire up into the kidney.  I was not able to advance the scope over the wire.  There was significant edema and tissue overgrowth from the stone that looked like a chronic process.  Therefore withdrew the scope along with the access sheath.  I advanced an open-ended ureteral catheter over the wire and advanced it passed the area of the prior stone up to the level of the kidney.  I withdrew the wire and shot a retrograde pyelogram and confirmed that we were in the kidney.  The kidney was hydronephrotic.  I replaced the sensor wire and withdrew the open-ended ureteral catheter.  I backloaded the wire onto rigid cystoscope and advanced that into the bladder followed by routine placement of a 6 x 24 double-J ureteral stent.  Fluoroscopy confirmed proximal placement and direct visualization confirmed a good coil within the bladder.  I drained the bladder and withdrew the scope.  Patient tolerated the procedure well and was stable postoperatively.  Plan: Follow-up in about 3 to 4 weeks for stent removal.

## 2021-01-22 NOTE — Transfer of Care (Signed)
Immediate Anesthesia Transfer of Care Note  Patient: Alexis Brady  Procedure(s) Performed: CYSTOSCOPY LEFT URETEROSCOPY/HOLMIUM LASER/STENT PLACEMENT (Left: Ureter)  Patient Location: PACU  Anesthesia Type:General  Level of Consciousness: sedated  Airway & Oxygen Therapy: Patient Spontanous Breathing and Patient connected to face mask oxygen  Post-op Assessment: Report given to RN and Post -op Vital signs reviewed and stable  Post vital signs: Reviewed and stable  Last Vitals:  Vitals Value Taken Time  BP 124/69 01/22/21 1204  Temp    Pulse 58 01/22/21 1205  Resp 12 01/22/21 1205  SpO2 100 % 01/22/21 1205  Vitals shown include unvalidated device data.  Last Pain:  Vitals:   01/22/21 0909  TempSrc: Oral         Complications: No notable events documented.

## 2021-01-22 NOTE — Discharge Instructions (Signed)
[facility] [phone] Post Ureteroscopy With or Without Stent Instructions  Definitions:  Ureter: The duct that transports urine from the kidney to the bladder. Stent:   A plastic hollow tube that is placed into the ureter, from the kidney to the                 bladder to prevent the ureter from swelling shut.  GENERAL INSTRUCTIONS:  Despite the fact that no skin incisions were used, the area around the ureter and bladder is raw and irritated. The stent is a foreign body which will further irritate the bladder wall. This irritation is manifested by increased frequency of urination, both day and night, and by an increase in the urge to urinate. In some, the urge to urinate is present almost always. Sometimes the urge is strong enough that you may not be able to stop yourself from urinating. The only real cure is to remove the stent and then give time for the bladder wall to heal which can't be done until the danger of the ureter swelling shut has passed, which varies.  You may see some blood in your urine while the stent is in place and a few days afterwards. Do not be alarmed, even if the urine was clear for a while. Get off your feet and drink lots of fluids until clearing occurs. If you start to pass clots or don't improve, call us.  DIET: You may return to your normal diet immediately. Because of the raw surface of your bladder, alcohol, spicy foods, acid type foods and drinks with caffeine may cause irritation or frequency and should be used in moderation. To keep your urine flowing freely and to avoid constipation, drink plenty of fluids during the day ( 8-10 glasses ). Tip: Avoid cranberry juice because it is very acidic.  ACTIVITY: Your physical activity doesn't need to be restricted. However, if you are very active, you may see some blood in your urine. We suggest that you reduce your activity under these circumstances until the bleeding has stopped.  BOWELS: It is  important to keep your bowels regular during the postoperative period. Straining with bowel movements can cause bleeding. A bowel movement every other day is reasonable. Use a mild laxative if needed, such as Milk of Magnesia 2-3 tablespoons, or 2 Dulcolax tablets. Call if you continue to have problems. If you have been taking narcotics for pain, before, during or after your surgery, you may be constipated. Take a laxative if necessary.   MEDICATION: You should resume your pre-surgery medications unless told not to. You may take oxybutynin or flomax if prescribed for bladder spasms or discomfort from the stent Take pain medication as directed for pain refractory to conservative management  PROBLEMS YOU SHOULD REPORT TO [place]: Fevers over 100.5 Fahrenheit. Heavy bleeding, or clots ( See above notes about blood in urine ). Inability to urinate. Drug reactions ( hives, rash, nausea, vomiting, diarrhea ). Severe burning or pain with urination that is not improving.

## 2021-01-23 ENCOUNTER — Encounter (HOSPITAL_COMMUNITY): Payer: Self-pay | Admitting: Urology

## 2021-01-23 NOTE — Anesthesia Postprocedure Evaluation (Signed)
Anesthesia Post Note  Patient: Camara Renstrom  Procedure(s) Performed: CYSTOSCOPY LEFT URETEROSCOPY/HOLMIUM LASER/STENT PLACEMENT (Left: Ureter)     Patient location during evaluation: PACU Anesthesia Type: General Level of consciousness: awake and alert Pain management: pain level controlled Vital Signs Assessment: post-procedure vital signs reviewed and stable Respiratory status: spontaneous breathing, nonlabored ventilation, respiratory function stable and patient connected to nasal cannula oxygen Cardiovascular status: blood pressure returned to baseline and stable Postop Assessment: no apparent nausea or vomiting Anesthetic complications: no   No notable events documented.  Last Vitals:  Vitals:   01/22/21 1245 01/22/21 1306  BP: 101/65 123/70  Pulse: (!) 53 (!) 58  Resp: 16 14  Temp: 36.4 C   SpO2: 100% 100%    Last Pain:  Vitals:   01/22/21 1306  TempSrc:   PainSc: 0-No pain                 Cadey Bazile L Georgie Haque

## 2021-02-19 ENCOUNTER — Telehealth: Payer: Self-pay | Admitting: Neurology

## 2021-02-19 NOTE — Telephone Encounter (Signed)
Patient called wanting to know if she should continue taking her primidone 50 MG and topiramate 100 MG the same since having her surgery?  She said it is a long story and she'd prefer to go into the details with the medical assistant.

## 2021-02-19 NOTE — Telephone Encounter (Signed)
Patient wanted to know if she should continue the two medications that she was taking before the procedure Primidone and topiramate. Patient also updating Dr. Carles Collet on progress since Focused Ultrasound  She is able to cook and do laundry normal household chores which she struggled with before procedure  Right hand is better still shaking but handwriting has gotten better Left hand still  bad and walking without a cane but feeling off balance a little Patient has a follow up with Dr. Carles Collet and Allie Dimmer both in January Patient still voicing concerns over the fact we were unable to connect her to other individuals  whom had this procedure in the past

## 2021-02-19 NOTE — Telephone Encounter (Signed)
Pt called and informed Balance issues can be part of the procedure, as we talked about beforehand. Dr Tat can  see she has called [facility] on 11/15 about some of those issues (clumsy hand).  She will need to direct her calls to them about anything related to the procedure.  We don't do that procedure here and will rely on them for that.  If she feels that she still needs the meds for her opposite hand for the tremor, then she can stay on them until next visit.  [facility] should have given her some guidelines as to what to do with her meds for tremor.  Pt stated that she is very disappointed in Dr Tat for not introducing her to people who had this procedure before her so that she could talk to them and and see how they are doing and learn the side effects of the procedure, she is repeated several times how disappointed she was in not getting to other people before going to the [facility].

## 2021-02-20 NOTE — Telephone Encounter (Signed)
Pt called an informed that Dr Tat is sorry to hear she is disappointed but we don't do that procedure here so don't have patients to introduce her to.  That is the job of the clinic that does the surgery.  We offer dbs here and make those services available to patients who have dbs.  She didn't want dbs surgery so we referred her, at her request.  Its a bit unreasonable to think that we would be able to offer her services that are not performed in [place].

## 2021-03-26 NOTE — Progress Notes (Deleted)
Assessment/Plan:    1.  Essential Tremor, but wonder if dystonic component (didn't look like it when writing today)  -*** primidone, 50 mg, 2 po bid.  -I am going to go ahead and wean her off of the Topamax.  She has had yet another kidney stone.  -Patient is status post focused ultrasound at [facility] to the left VIM.  As below, the patient expressed her frustration that we were not able to provide her access to other patients she could talk with who have had the procedure done.  Explained to her that we do not do focused ultrasound in our community, and any questions about focused ultrasound really need to be directed to the place that did her focused ultrasound.   2.  Spasmodic dysphonia  -Greatly improved with focused ultrasound.  Had Botox in the past and did not want to repeat that.  Subjective:   Alexis Brady was seen today in follow up for essential tremor.  My previous records were reviewed prior to todays visit.  Patient went to [facility] for focused ultrasound and had that completed on November 11 to the left VIM.  This did help the patient's voice and tremor on the right.  She called here both before and after the procedure, frustrated that our office could not provide her with access to other patients who have had the procedure done (because we do not do focused ultrasound in our community).  She did experience loss of balance after the procedure (known complication that we also discussed with her by [place]).  She has also had clumsiness of the right hand.  She also experienced burning sensation to the right side of the tongue and change in taste.  She has been working with therapies.  Separately, the patient did have left ureteroscopy with lithotripsy and stent placement in November due to a left ureteral calculus.  Current prescribed movement disorder medications: Primidone, 50 mg, 4 tablets at bed (increased since last visit) Topiramate, 100 mg daily  Prior medications: Gabapentin, 300  mg, 2 tablets twice per day; on topiramate (but does have history of nephrolithiasis)  ALLERGIES:  No Known Allergies  CURRENT MEDICATIONS:  Outpatient Encounter Medications as of 03/31/2021  Medication Sig   Calcium Carb-Cholecalciferol (CALCIUM 600 + D PO) Take 1 tablet by mouth 2 (two) times daily.   Cholecalciferol (VITAMIN D) 50 MCG (2000 UT) tablet Take 2,000 Units by mouth daily.   Coenzyme Q10 300 MG CAPS Take 300 mg by mouth daily.   Cyanocobalamin (B-12) 5000 MCG CAPS Take 5,000 mcg by mouth daily.   HYDROcodone-acetaminophen (NORCO) 5-325 MG tablet Take 1 tablet by mouth every 4 (four) hours as needed for moderate pain.   mesalamine (LIALDA) 1.2 g EC tablet TAKE 2 TABLETS BY MOUTH DAILY WITH BREAKFAST   naproxen sodium (ALEVE) 220 MG tablet Take 220 mg by mouth in the morning and at bedtime.   primidone (MYSOLINE) 50 MG tablet Take 2 tablets (100 mg total) by mouth 2 (two) times daily.   topiramate (TOPAMAX) 100 MG tablet TAKE 1 TABLET(100 MG) BY MOUTH DAILY   zinc gluconate 50 MG tablet Take 50 mg by mouth daily.   No facility-administered encounter medications on file as of 03/31/2021.     Objective:    PHYSICAL EXAMINATION:    VITALS:   There were no vitals filed for this visit.    GEN:  The patient appears stated age and is in NAD. HEENT:  Normocephalic, atraumatic.  The  mucous membranes are moist.   Neurological examination:  Orientation: The patient is alert and oriented x3. Cranial nerves: There is good facial symmetry. The speech is fluent with spasmodic dysphonia. Soft palate rises symmetrically and there is no tongue deviation. Hearing is intact to conversational tone. Sensation: Sensation is intact to light touch throughout Motor: Strength is at least antigravity x4.  Movement examination: Tone: There is normal tone in the UE/LE Abnormal movements: she has little postural tremor but more mod when hands have a weight, L >R.  She has head tremor in the yes  direction.  Has trouble with archimedes spirals bilaterally. Coordination:  There is no decremation with RAM's,  I have reviewed and interpreted the following labs independently   Chemistry      Component Value Date/Time   NA 139 01/16/2021 0949   K 4.2 01/16/2021 0949   CL 111 01/16/2021 0949   CO2 25 01/16/2021 0949   BUN 30 (H) 01/16/2021 0949   CREATININE 1.04 (H) 01/16/2021 0949      Component Value Date/Time   CALCIUM 8.6 (L) 01/16/2021 0949      Lab Results  Component Value Date   WBC 5.7 01/16/2021   HGB 11.2 (L) 01/16/2021   HCT 36.9 01/16/2021   MCV 66.5 (L) 01/16/2021   PLT 178 01/16/2021   No results found for: TSH   Chemistry      Component Value Date/Time   NA 139 01/16/2021 0949   K 4.2 01/16/2021 0949   CL 111 01/16/2021 0949   CO2 25 01/16/2021 0949   BUN 30 (H) 01/16/2021 0949   CREATININE 1.04 (H) 01/16/2021 0949      Component Value Date/Time   CALCIUM 8.6 (L) 01/16/2021 0949         Total time spent on today's visit was *** minutes, including both face-to-face time and nonface-to-face time.  Time included that spent on review of records (prior notes available to me/labs/imaging if pertinent), discussing treatment and goals, answering patient's questions and coordinating care.  Cc:  Ginger Organ., MD

## 2021-03-31 ENCOUNTER — Ambulatory Visit: Payer: Medicare Other | Admitting: Neurology

## 2021-03-31 ENCOUNTER — Telehealth: Payer: Self-pay | Admitting: Neurology

## 2021-03-31 NOTE — Telephone Encounter (Signed)
Pt didn't show to appt today but called about 15 min after the appt stating she didn't have transportation and was going to be transferring care to new neurologist.  I was going to be d/c her topamax today, noting she has had further kidney stones.  Back in August, 2022, I had talked to her urologist and told him it was okay to stop the Topamax, but he stated that hopefully he would be able to get a stone analysis from the fragments and a 24-hour urine to assess the citrate level in the urine and he would then be able to better decide whether or not we would need to stop the Topamax.  I did not hear back after that, but I think it is prudent that we go ahead and wean the Topamax, especially since she already had focused ultrasound.  Tell the patient to decrease her topiramate to 50 mg once per day (which will be half of the tablet she currently has, since she has a 100 mg tablet) for 2 weeks and then she can go ahead and stop the topiramate.

## 2021-03-31 NOTE — Telephone Encounter (Signed)
Called patient to let her know to start weaning herself off of the Topamax. She will be taking half 50 mg a day for two weeks and then discontinue. She is going to continue taking her primidone at this time .Patient stated she missed today's appointment due to transportation bur she did let me know she is moving and therefore looking for a new Neurologist

## 2021-05-06 ENCOUNTER — Telehealth: Payer: Self-pay | Admitting: Neurology

## 2021-05-06 ENCOUNTER — Other Ambulatory Visit: Payer: Self-pay

## 2021-05-06 DIAGNOSIS — G250 Essential tremor: Secondary | ICD-10-CM

## 2021-05-06 MED ORDER — PRIMIDONE 50 MG PO TABS: 100.0000 mg | ORAL_TABLET | Freq: Two times a day (BID) | ORAL | 0 refills | Status: AC

## 2021-05-06 NOTE — Telephone Encounter (Signed)
1. Which medications need refilled? (List name and dosage, if known) primidone - pt is moving next week to [place]. She needs to switch pharmacies  2. Which pharmacy/location is medication to be sent to? (include street and city if local pharmacy) [place]

## 2021-05-30 ENCOUNTER — Other Ambulatory Visit: Payer: Self-pay | Admitting: Gastroenterology

## 2021-06-20 ENCOUNTER — Telehealth: Payer: Self-pay | Admitting: Cardiology

## 2021-06-20 NOTE — Telephone Encounter (Signed)
I am not familiar with the area unfortunately, but I see [facility] an [facility] in [place] and their Cardiologists get good reviews! ?

## 2021-06-20 NOTE — Telephone Encounter (Signed)
Patient has just moved to [place], [place] and she wants Dr. Johney Frame to give her some cardiologist she recommends in that area. Please call back ?

## 2021-06-20 NOTE — Telephone Encounter (Signed)
Will route this message to Dr. Johney Frame to further advise on. ?Will follow-up with the pt accordingly thereafter, with her recommendations. ?

## 2021-06-20 NOTE — Telephone Encounter (Signed)
Pt aware of recommendation per Dr. Johney Frame on a good Cardiology group in [place].  ?Pt verbalized understanding and was gracious for all the assistance provided.  ?

## 2021-07-02 ENCOUNTER — Telehealth (HOSPITAL_COMMUNITY): Payer: Self-pay | Admitting: Physician Assistant

## 2021-07-02 NOTE — Telephone Encounter (Signed)
07/02/21 patient cancelled Echocardiogram due to she moved out of town and following new cardiologist/[facility]  ?Order will be removed from the echo WQ.  ?

## 2021-07-07 ENCOUNTER — Other Ambulatory Visit (HOSPITAL_COMMUNITY): Payer: Medicare Other
